# Patient Record
Sex: Male | Born: 1970
Health system: Southern US, Community
[De-identification: ages and names within clinical notes are randomized; demographics above are authoritative.]

## PROBLEM LIST (undated history)

## (undated) DIAGNOSIS — M549 Dorsalgia, unspecified: Secondary | ICD-10-CM

## (undated) DIAGNOSIS — G4730 Sleep apnea, unspecified: Secondary | ICD-10-CM

## (undated) DIAGNOSIS — F32A Depression, unspecified: Secondary | ICD-10-CM

## (undated) DIAGNOSIS — E669 Obesity, unspecified: Secondary | ICD-10-CM

## (undated) DIAGNOSIS — K219 Gastro-esophageal reflux disease without esophagitis: Secondary | ICD-10-CM

## (undated) DIAGNOSIS — G8929 Other chronic pain: Secondary | ICD-10-CM

## (undated) DIAGNOSIS — D649 Anemia, unspecified: Secondary | ICD-10-CM

## (undated) DIAGNOSIS — Z22322 Carrier or suspected carrier of Methicillin resistant Staphylococcus aureus: Secondary | ICD-10-CM

## (undated) DIAGNOSIS — I509 Heart failure, unspecified: Secondary | ICD-10-CM

## (undated) DIAGNOSIS — I10 Essential (primary) hypertension: Secondary | ICD-10-CM

## (undated) DIAGNOSIS — J449 Chronic obstructive pulmonary disease, unspecified: Secondary | ICD-10-CM

## (undated) DIAGNOSIS — I2699 Other pulmonary embolism without acute cor pulmonale: Secondary | ICD-10-CM

## (undated) DIAGNOSIS — I872 Venous insufficiency (chronic) (peripheral): Secondary | ICD-10-CM

## (undated) DIAGNOSIS — M542 Cervicalgia: Secondary | ICD-10-CM

## (undated) DIAGNOSIS — F329 Major depressive disorder, single episode, unspecified: Secondary | ICD-10-CM

## (undated) DIAGNOSIS — I2510 Atherosclerotic heart disease of native coronary artery without angina pectoris: Secondary | ICD-10-CM

## (undated) DIAGNOSIS — G959 Disease of spinal cord, unspecified: Secondary | ICD-10-CM

## (undated) HISTORY — PX: SKIN GRAFT: SHX250

## (undated) HISTORY — PX: ROTATOR CUFF REPAIR: SHX139

## (undated) HISTORY — PX: FEMUR SURGERY: SHX943

## (undated) HISTORY — PX: LIVER SURGERY: SHX698

---

## 2006-03-09 ENCOUNTER — Ambulatory Visit: Payer: Self-pay | Admitting: Cardiology

## 2007-01-29 ENCOUNTER — Ambulatory Visit: Payer: Self-pay | Admitting: Cardiology

## 2007-01-29 ENCOUNTER — Inpatient Hospital Stay (HOSPITAL_COMMUNITY): Admission: EM | Admit: 2007-01-29 | Discharge: 2007-01-31 | Payer: Self-pay | Admitting: Cardiology

## 2007-05-19 ENCOUNTER — Encounter: Admission: RE | Admit: 2007-05-19 | Discharge: 2007-05-19 | Payer: Self-pay | Admitting: Sports Medicine

## 2007-06-15 ENCOUNTER — Encounter: Admission: RE | Admit: 2007-06-15 | Discharge: 2007-06-15 | Payer: Self-pay | Admitting: Sports Medicine

## 2007-06-29 ENCOUNTER — Encounter: Admission: RE | Admit: 2007-06-29 | Discharge: 2007-06-29 | Payer: Self-pay | Admitting: Sports Medicine

## 2008-04-17 ENCOUNTER — Encounter: Admission: RE | Admit: 2008-04-17 | Discharge: 2008-04-17 | Payer: Self-pay | Admitting: Orthopaedic Surgery

## 2008-05-12 ENCOUNTER — Emergency Department (HOSPITAL_COMMUNITY): Admission: EM | Admit: 2008-05-12 | Discharge: 2008-05-12 | Payer: Self-pay | Admitting: Emergency Medicine

## 2008-05-20 ENCOUNTER — Emergency Department (HOSPITAL_COMMUNITY): Admission: EM | Admit: 2008-05-20 | Discharge: 2008-05-20 | Payer: Self-pay | Admitting: Emergency Medicine

## 2008-05-23 ENCOUNTER — Encounter: Admission: RE | Admit: 2008-05-23 | Discharge: 2008-05-27 | Payer: Self-pay | Admitting: Anesthesiology

## 2008-05-27 ENCOUNTER — Ambulatory Visit: Payer: Self-pay | Admitting: Anesthesiology

## 2008-05-27 ENCOUNTER — Emergency Department (HOSPITAL_COMMUNITY): Admission: EM | Admit: 2008-05-27 | Discharge: 2008-05-27 | Payer: Self-pay | Admitting: Emergency Medicine

## 2008-06-28 ENCOUNTER — Encounter: Admission: RE | Admit: 2008-06-28 | Discharge: 2008-06-28 | Payer: Self-pay | Admitting: Orthopaedic Surgery

## 2008-11-17 ENCOUNTER — Ambulatory Visit: Payer: Self-pay | Admitting: Cardiology

## 2009-08-04 ENCOUNTER — Emergency Department (HOSPITAL_COMMUNITY): Admission: EM | Admit: 2009-08-04 | Discharge: 2009-08-04 | Payer: Self-pay | Admitting: Emergency Medicine

## 2010-02-01 ENCOUNTER — Encounter: Payer: Self-pay | Admitting: Sports Medicine

## 2010-03-27 LAB — POCT CARDIAC MARKERS
CKMB, poc: <1 ng/mL — ABNORMAL LOW (ref 1.0–8.0)
Myoglobin, poc: 63.9 ng/mL (ref 12–200)
Troponin i, poc: <0.05 ng/mL (ref 0.00–0.09)

## 2010-03-27 LAB — POCT I-STAT, CHEM 8
Chloride: 100 mEq/L (ref 96–112)
Creatinine, Ser: 1 mg/dL (ref 0.4–1.5)
Glucose, Bld: 104 mg/dL — ABNORMAL HIGH (ref 70–99)
Hemoglobin: 13.6 g/dL (ref 13.0–17.0)
Potassium: 3.6 mEq/L (ref 3.5–5.1)
Sodium: 141 mEq/L (ref 135–145)

## 2010-04-20 LAB — CBC
MCHC: 33.3 g/dL (ref 30.0–36.0)
MCV: 84.4 fL (ref 78.0–100.0)
RDW: 17.5 % — ABNORMAL HIGH (ref 11.5–15.5)
WBC: 7.7 10*3/uL (ref 4.0–10.5)

## 2010-04-20 LAB — DIFFERENTIAL
Basophils Absolute: 0 K/uL (ref 0.0–0.1)
Basophils Relative: 0 % (ref 0–1)
Eosinophils Absolute: 0.3 K/uL (ref 0.0–0.7)
Eosinophils Relative: 4 % (ref 0–5)
Lymphocytes Relative: 13 % (ref 12–46)
Lymphs Abs: 1 K/uL (ref 0.7–4.0)
Monocytes Absolute: 0.5 K/uL (ref 0.1–1.0)
Monocytes Relative: 6 % (ref 3–12)
Neutro Abs: 6 K/uL (ref 1.7–7.7)
Neutrophils Relative %: 77 % (ref 43–77)

## 2010-04-20 LAB — BASIC METABOLIC PANEL
BUN: 16 mg/dL (ref 6–23)
Glucose, Bld: 154 mg/dL — ABNORMAL HIGH (ref 70–99)
Potassium: 4.2 mEq/L (ref 3.5–5.1)
Sodium: 141 mEq/L (ref 135–145)

## 2010-04-20 LAB — ETHANOL

## 2010-04-20 LAB — ACETAMINOPHEN LEVEL: Acetaminophen (Tylenol), Serum: <10 ug/mL — ABNORMAL LOW (ref 10–30)
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
## 2010-05-25 NOTE — Cardiovascular Report (Signed)
NAME:  [name], [name]                ACCOUNT NO.:  [id number]   MEDICAL RECORD NO.:  [id number]          PATIENT TYPE:  INP   LOCATION:  3314                         FACILITY:  [facility]   PHYSICIAN:  Veverly Fells. Excell Seltzer, MD  DATE OF BIRTH:  Feb 05, 1970   DATE OF PROCEDURE:  01/30/2007  DATE OF DISCHARGE:                            CARDIAC CATHETERIZATION   PROCEDURE:  Left heart catheterization, selective coronary angiography,  left ventricular angiography, Star close the right femoral artery.   INDICATIONS:  Cody Cordova is a 40 year old gentleman with diabetes and  morbid obesity.  He presented with a chest pain syndrome.  In the  setting of multiple risk factors.  He was referred for cardiac  catheterization.   Risks and indications of procedure were reviewed with the patient.  Informed consent was obtained.  The right groin was prepped, draped,  anesthetized with 1% lidocaine. Using modified Seldinger technique 6-  French sheath was placed in the right femoral artery.  Standard 6-French  Judkins catheters were used for coronary angiography an angled pigtail  catheter was used for left ventriculography.  All catheter exchanges  were performed over a guidewire.  At completion of procedure, a Star  close device was used to seal the femoral arteriotomy.  There were no  immediate complications.   FINDINGS:  Aortic pressure 139/94 with a mean of 116, left ventricular  pressure 138/25.   Left mainstem:  The left mainstem has no significant angiographic  stenosis.  There is minimal plaque present.  The left main bifurcates  into the LAD and left circumflex.   The LAD is a large-caliber vessel that courses down and reaches the left  ventricular apex.  There is a small first diagonal and a large second  diagonal branch present. The second diagonal bifurcates into twin  vessels and has no significant angiographic stenosis.  There is minimal  plaque in the proximal LAD but no significant stenosis  throughout.   Left circumflex:  Left circumflex is large-caliber.  There is mild  ectasia in the proximal to mid vessel with no significant stenosis  associated.  There is a small first OM, medium second OM, and a larger  left posterolateral branch.  There is also an atrial branch that arises  from the mid circumflex.  There is no significant angiographic stenosis  throughout the left circumflex.   The right coronary artery is codominant with the circumflex.  There is  diffuse minor plaque throughout the proximal and mid vessel. There is a  large acute marginal branch that arises from the mid vessel.  The PDA  appears to supply only the base of the inferior wall.  There is no  significant stenosis in the PDA.   Left ventricular function:  The LVEF is normal, 65-70%.  There is no  mitral regurgitation.   ASSESSMENT:  1. Minimal diffuse nonobstructive CAD.  2. Normal left ventricular function.   [name] does not have any significant stenoses.  He should continue  therapy for his diabetes and hypertension.  He can follow-up with his  primary care physician.  Veverly Fells. Excell Seltzer, MD  Electronically Signed     MDC/MEDQ  D:  01/30/2007  T:  01/31/2007  Job:  [id number]

## 2010-09-15 ENCOUNTER — Other Ambulatory Visit: Payer: Self-pay | Admitting: Internal Medicine

## 2010-09-15 DIAGNOSIS — M545 Low back pain: Secondary | ICD-10-CM

## 2010-09-19 ENCOUNTER — Inpatient Hospital Stay
Admission: RE | Admit: 2010-09-19 | Discharge: 2010-09-19 | Payer: Self-pay | Source: Ambulatory Visit | Attending: Internal Medicine | Admitting: Internal Medicine

## 2010-10-01 LAB — COMPREHENSIVE METABOLIC PANEL
AST: 24
Calcium: 8.9
Creatinine, Ser: 0.84
GFR calc non Af Amer: >60
Total Bilirubin: 0.6

## 2010-10-01 LAB — LIPID PANEL
LDL Cholesterol: 115 — ABNORMAL HIGH
Total CHOL/HDL Ratio: 4.3
Triglycerides: 46
VLDL: 9

## 2010-10-01 LAB — CARDIAC PANEL(CRET KIN+CKTOT+MB+TROPI)
CK, MB: 0.8
CK, MB: 0.9
Relative Index: INVALID
Relative Index: INVALID
Total CK: 96
Total CK: 99
Troponin I: 0.01
Troponin I: 0.01
Troponin I: 0.01

## 2010-10-01 LAB — HEMOGLOBIN A1C: Mean Plasma Glucose: 186

## 2010-10-01 LAB — HEPARIN LEVEL (UNFRACTIONATED): Heparin Unfractionated: 0.5

## 2010-10-11 DIAGNOSIS — Z22322 Carrier or suspected carrier of Methicillin resistant Staphylococcus aureus: Secondary | ICD-10-CM

## 2010-10-11 HISTORY — DX: Carrier or suspected carrier of methicillin resistant Staphylococcus aureus: Z22.322

## 2010-10-13 ENCOUNTER — Inpatient Hospital Stay (HOSPITAL_COMMUNITY): Payer: PRIVATE HEALTH INSURANCE

## 2010-10-13 ENCOUNTER — Inpatient Hospital Stay (HOSPITAL_COMMUNITY)
Admission: AD | Admit: 2010-10-13 | Discharge: 2010-11-01 | DRG: 471 | Disposition: A | Discharge status: Rehabilitation Facility | Payer: PRIVATE HEALTH INSURANCE | Source: Ambulatory Visit | Attending: Orthopedic Surgery | Admitting: Orthopedic Surgery

## 2010-10-13 DIAGNOSIS — L0100 Impetigo, unspecified: Secondary | ICD-10-CM | POA: Diagnosis not present

## 2010-10-13 DIAGNOSIS — G4733 Obstructive sleep apnea (adult) (pediatric): Secondary | ICD-10-CM | POA: Diagnosis present

## 2010-10-13 DIAGNOSIS — F3289 Other specified depressive episodes: Secondary | ICD-10-CM | POA: Diagnosis present

## 2010-10-13 DIAGNOSIS — Z981 Arthrodesis status: Secondary | ICD-10-CM

## 2010-10-13 DIAGNOSIS — Z79899 Other long term (current) drug therapy: Secondary | ICD-10-CM

## 2010-10-13 DIAGNOSIS — M5000 Cervical disc disorder with myelopathy, unspecified cervical region: Principal | ICD-10-CM | POA: Diagnosis present

## 2010-10-13 DIAGNOSIS — I2510 Atherosclerotic heart disease of native coronary artery without angina pectoris: Secondary | ICD-10-CM | POA: Diagnosis present

## 2010-10-13 DIAGNOSIS — I872 Venous insufficiency (chronic) (peripheral): Secondary | ICD-10-CM | POA: Diagnosis present

## 2010-10-13 DIAGNOSIS — M109 Gout, unspecified: Secondary | ICD-10-CM | POA: Diagnosis present

## 2010-10-13 DIAGNOSIS — A4902 Methicillin resistant Staphylococcus aureus infection, unspecified site: Secondary | ICD-10-CM | POA: Diagnosis not present

## 2010-10-13 DIAGNOSIS — J449 Chronic obstructive pulmonary disease, unspecified: Secondary | ICD-10-CM | POA: Diagnosis present

## 2010-10-13 DIAGNOSIS — Z5309 Procedure and treatment not carried out because of other contraindication: Secondary | ICD-10-CM

## 2010-10-13 DIAGNOSIS — J4489 Other specified chronic obstructive pulmonary disease: Secondary | ICD-10-CM | POA: Diagnosis present

## 2010-10-13 DIAGNOSIS — G8250 Quadriplegia, unspecified: Secondary | ICD-10-CM | POA: Diagnosis present

## 2010-10-13 DIAGNOSIS — F329 Major depressive disorder, single episode, unspecified: Secondary | ICD-10-CM | POA: Diagnosis present

## 2010-10-13 DIAGNOSIS — Z794 Long term (current) use of insulin: Secondary | ICD-10-CM

## 2010-10-13 DIAGNOSIS — D649 Anemia, unspecified: Secondary | ICD-10-CM | POA: Diagnosis present

## 2010-10-13 DIAGNOSIS — E876 Hypokalemia: Secondary | ICD-10-CM | POA: Diagnosis present

## 2010-10-13 DIAGNOSIS — K5900 Constipation, unspecified: Secondary | ICD-10-CM | POA: Diagnosis not present

## 2010-10-13 DIAGNOSIS — J9600 Acute respiratory failure, unspecified whether with hypoxia or hypercapnia: Secondary | ICD-10-CM | POA: Diagnosis not present

## 2010-10-13 DIAGNOSIS — K219 Gastro-esophageal reflux disease without esophagitis: Secondary | ICD-10-CM | POA: Diagnosis present

## 2010-10-13 DIAGNOSIS — T502X5A Adverse effect of carbonic-anhydrase inhibitors, benzothiadiazides and other diuretics, initial encounter: Secondary | ICD-10-CM | POA: Diagnosis present

## 2010-10-13 DIAGNOSIS — Z418 Encounter for other procedures for purposes other than remedying health state: Secondary | ICD-10-CM

## 2010-10-13 DIAGNOSIS — Z2989 Encounter for other specified prophylactic measures: Secondary | ICD-10-CM

## 2010-10-13 DIAGNOSIS — E119 Type 2 diabetes mellitus without complications: Secondary | ICD-10-CM | POA: Diagnosis present

## 2010-10-13 DIAGNOSIS — I10 Essential (primary) hypertension: Secondary | ICD-10-CM | POA: Diagnosis present

## 2010-10-13 DIAGNOSIS — Z6841 Body Mass Index (BMI) 40.0 and over, adult: Secondary | ICD-10-CM

## 2010-10-13 DIAGNOSIS — E785 Hyperlipidemia, unspecified: Secondary | ICD-10-CM | POA: Diagnosis present

## 2010-10-13 HISTORY — DX: Atherosclerotic heart disease of native coronary artery without angina pectoris: I25.10

## 2010-10-13 HISTORY — DX: Chronic obstructive pulmonary disease, unspecified: J44.9

## 2010-10-13 HISTORY — DX: Essential (primary) hypertension: I10

## 2010-10-13 HISTORY — DX: Venous insufficiency (chronic) (peripheral): I87.2

## 2010-10-13 HISTORY — DX: Obesity, unspecified: E66.9

## 2010-10-13 LAB — CBC
HCT: 34.3 % — ABNORMAL LOW (ref 39.0–52.0)
Hemoglobin: 10.9 g/dL — ABNORMAL LOW (ref 13.0–17.0)
MCH: 24.8 pg — ABNORMAL LOW (ref 26.0–34.0)
RBC: 4.39 MIL/uL (ref 4.22–5.81)

## 2010-10-13 LAB — COMPREHENSIVE METABOLIC PANEL
ALT: 20 U/L (ref 0–53)
Albumin: 3.5 g/dL (ref 3.5–5.2)
Alkaline Phosphatase: 99 U/L (ref 39–117)
Chloride: 95 mEq/L — ABNORMAL LOW (ref 96–112)
Potassium: 2.9 mEq/L — ABNORMAL LOW (ref 3.5–5.1)
Sodium: 138 mEq/L (ref 135–145)
Total Bilirubin: 0.3 mg/dL (ref 0.3–1.2)
Total Protein: 7.6 g/dL (ref 6.0–8.3)

## 2010-10-13 LAB — DIFFERENTIAL
Basophils Absolute: 0 10*3/uL (ref 0.0–0.1)
Basophils Relative: 0 % (ref 0–1)
Lymphocytes Relative: 16 % (ref 12–46)
Monocytes Absolute: 0.5 10*3/uL (ref 0.1–1.0)
Monocytes Relative: 8 % (ref 3–12)
Neutro Abs: 5 10*3/uL (ref 1.7–7.7)
Neutrophils Relative %: 73 % (ref 43–77)

## 2010-10-13 LAB — PROTIME-INR
INR: 1 (ref 0.00–1.49)
Prothrombin Time: 13.4 seconds (ref 11.6–15.2)

## 2010-10-14 ENCOUNTER — Inpatient Hospital Stay (HOSPITAL_COMMUNITY): Payer: PRIVATE HEALTH INSURANCE

## 2010-10-14 LAB — URINALYSIS, MICROSCOPIC ONLY
Leukocytes, UA: NEGATIVE
Nitrite: NEGATIVE
Specific Gravity, Urine: 1.014 (ref 1.005–1.030)
Urobilinogen, UA: 1 mg/dL (ref 0.0–1.0)

## 2010-10-14 LAB — GLUCOSE, CAPILLARY
Glucose-Capillary: 143 mg/dL — ABNORMAL HIGH (ref 70–99)
Glucose-Capillary: 142 mg/dL — ABNORMAL HIGH (ref 70–99)

## 2010-10-15 ENCOUNTER — Inpatient Hospital Stay (HOSPITAL_COMMUNITY): Payer: PRIVATE HEALTH INSURANCE

## 2010-10-15 LAB — HEMOGLOBIN A1C
Hgb A1c MFr Bld: 7.6 % — ABNORMAL HIGH (ref <5.7)
Mean Plasma Glucose: 171 mg/dL — ABNORMAL HIGH (ref <117)

## 2010-10-15 LAB — FOLATE: Folate: 8.9 ng/mL

## 2010-10-15 LAB — CBC
HCT: 33.6 % — ABNORMAL LOW (ref 39.0–52.0)
MCH: 24.4 pg — ABNORMAL LOW (ref 26.0–34.0)
MCHC: 30.4 g/dL (ref 30.0–36.0)
MCV: 80.4 fL (ref 78.0–100.0)
RDW: 16.9 % — ABNORMAL HIGH (ref 11.5–15.5)
WBC: 7.8 10*3/uL (ref 4.0–10.5)

## 2010-10-15 LAB — BASIC METABOLIC PANEL
BUN: 12 mg/dL (ref 6–23)
Chloride: 102 mEq/L (ref 96–112)
Creatinine, Ser: 0.81 mg/dL (ref 0.50–1.35)
GFR calc Af Amer: >90 mL/min (ref >90)

## 2010-10-15 LAB — VITAMIN B12: Vitamin B-12: 279 pg/mL (ref 211–911)

## 2010-10-15 LAB — LIPID PANEL
Total CHOL/HDL Ratio: 4.9 RATIO
VLDL: 17 mg/dL (ref 0–40)

## 2010-10-15 LAB — GLUCOSE, CAPILLARY: Glucose-Capillary: 105 mg/dL — ABNORMAL HIGH (ref 70–99)

## 2010-10-15 LAB — PHOSPHORUS: Phosphorus: 3.3 mg/dL (ref 2.3–4.6)

## 2010-10-15 LAB — IRON AND TIBC: TIBC: 337 ug/dL (ref 215–435)

## 2010-10-15 LAB — MAGNESIUM: Magnesium: 1.9 mg/dL (ref 1.5–2.5)

## 2010-10-16 LAB — CBC
HCT: 33.1 % — ABNORMAL LOW (ref 39.0–52.0)
MCHC: 29.9 g/dL — ABNORMAL LOW (ref 30.0–36.0)
MCV: 80.9 fL (ref 78.0–100.0)
Platelets: 218 10*3/uL (ref 150–400)
RDW: 17.2 % — ABNORMAL HIGH (ref 11.5–15.5)
WBC: 5.5 10*3/uL (ref 4.0–10.5)

## 2010-10-16 LAB — BASIC METABOLIC PANEL
BUN: 12 mg/dL (ref 6–23)
Calcium: 9.3 mg/dL (ref 8.4–10.5)
Chloride: 104 mEq/L (ref 96–112)
Creatinine, Ser: 0.81 mg/dL (ref 0.50–1.35)
GFR calc Af Amer: >90 mL/min (ref >90)
GFR calc non Af Amer: >90 mL/min (ref >90)

## 2010-10-16 LAB — GLUCOSE, CAPILLARY: Glucose-Capillary: 55 mg/dL — ABNORMAL LOW (ref 70–99)

## 2010-10-17 LAB — GLUCOSE, CAPILLARY
Glucose-Capillary: 76 mg/dL (ref 70–99)
Glucose-Capillary: 118 mg/dL — ABNORMAL HIGH (ref 70–99)
Glucose-Capillary: 60 mg/dL — ABNORMAL LOW (ref 70–99)

## 2010-10-17 LAB — BASIC METABOLIC PANEL
CO2: 31 mEq/L (ref 19–32)
Chloride: 103 mEq/L (ref 96–112)
Creatinine, Ser: 0.88 mg/dL (ref 0.50–1.35)
GFR calc Af Amer: >90 mL/min (ref >90)
Potassium: 3.6 mEq/L (ref 3.5–5.1)
Sodium: 141 mEq/L (ref 135–145)

## 2010-10-18 ENCOUNTER — Inpatient Hospital Stay (HOSPITAL_COMMUNITY): Payer: PRIVATE HEALTH INSURANCE

## 2010-10-18 ENCOUNTER — Encounter (HOSPITAL_COMMUNITY): Payer: Self-pay

## 2010-10-18 DIAGNOSIS — L02219 Cutaneous abscess of trunk, unspecified: Secondary | ICD-10-CM

## 2010-10-18 DIAGNOSIS — L0100 Impetigo, unspecified: Secondary | ICD-10-CM

## 2010-10-18 DIAGNOSIS — L03319 Cellulitis of trunk, unspecified: Secondary | ICD-10-CM

## 2010-10-18 LAB — GLUCOSE, CAPILLARY
Glucose-Capillary: 65 mg/dL — ABNORMAL LOW (ref 70–99)
Glucose-Capillary: 49 mg/dL — ABNORMAL LOW (ref 70–99)
Glucose-Capillary: 95 mg/dL (ref 70–99)

## 2010-10-18 LAB — GRAM STAIN

## 2010-10-19 LAB — GLUCOSE, CAPILLARY
Glucose-Capillary: 138 mg/dL — ABNORMAL HIGH (ref 70–99)
Glucose-Capillary: 77 mg/dL (ref 70–99)
Glucose-Capillary: 91 mg/dL (ref 70–99)

## 2010-10-20 LAB — CBC
Hemoglobin: 9.3 g/dL — ABNORMAL LOW (ref 13.0–17.0)
MCHC: 29.9 g/dL — ABNORMAL LOW (ref 30.0–36.0)
Platelets: 248 10*3/uL (ref 150–400)

## 2010-10-20 LAB — BASIC METABOLIC PANEL
BUN: 10 mg/dL (ref 6–23)
Calcium: 9.2 mg/dL (ref 8.4–10.5)
Chloride: 103 mEq/L (ref 96–112)
GFR calc Af Amer: >90 mL/min (ref >90)
GFR calc Af Amer: >90 mL/min (ref >90)
GFR calc non Af Amer: >90 mL/min (ref >90)
GFR calc non Af Amer: >90 mL/min (ref >90)
Glucose, Bld: 102 mg/dL — ABNORMAL HIGH (ref 70–99)
Potassium: 3.7 mEq/L (ref 3.5–5.1)
Potassium: 4 mEq/L (ref 3.5–5.1)
Sodium: 142 mEq/L (ref 135–145)
Sodium: 141 mEq/L (ref 135–145)

## 2010-10-20 LAB — VANCOMYCIN, TROUGH: Vancomycin Tr: 14.2 ug/mL (ref 10.0–20.0)

## 2010-10-20 LAB — GLUCOSE, CAPILLARY
Glucose-Capillary: 140 mg/dL — ABNORMAL HIGH (ref 70–99)
Glucose-Capillary: 103 mg/dL — ABNORMAL HIGH (ref 70–99)
Glucose-Capillary: 67 mg/dL — ABNORMAL LOW (ref 70–99)

## 2010-10-21 DIAGNOSIS — L0100 Impetigo, unspecified: Secondary | ICD-10-CM

## 2010-10-21 LAB — GLUCOSE, CAPILLARY
Glucose-Capillary: 51 mg/dL — ABNORMAL LOW (ref 70–99)
Glucose-Capillary: 59 mg/dL — ABNORMAL LOW (ref 70–99)
Glucose-Capillary: 111 mg/dL — ABNORMAL HIGH (ref 70–99)
Glucose-Capillary: 60 mg/dL — ABNORMAL LOW (ref 70–99)
Glucose-Capillary: 106 mg/dL — ABNORMAL HIGH (ref 70–99)
Glucose-Capillary: 165 mg/dL — ABNORMAL HIGH (ref 70–99)

## 2010-10-21 LAB — CROSSMATCH
ABO/RH(D): A POS
Antibody Screen: NEGATIVE
Unit division: 0

## 2010-10-21 LAB — WOUND CULTURE

## 2010-10-21 NOTE — Consult Note (Signed)
NAME:  Cody Cordova, Cody Cordova NO.:  [id number]  MEDICAL RECORD NO.:  [id number]  LOCATION:  5509                         FACILITY:  [facility]  PHYSICIAN:  Rosanna Randy, MDDATE OF BIRTH:  23-Jun-1970  DATE OF CONSULTATION:  10/14/2010 DATE OF DISCHARGE:                                CONSULTATION   REASON FOR CONSULTATION:  Help managing medical problems (the patient with diabetes, hypertension, hyperlipidemia, asthma/COPD, and hypokalemia).  PRIMARY CARE PHYSICIAN:  [name]  HISTORY OF PRESENT ILLNESS:  Cody Cordova is a 40 year old male with a past medical history of diabetes, hypertension, hyperlipidemia, COPD, and obesity admitted secondary to cervical myelopathy/weakness in the acute setting after history of fall.  The patient is now complaining of weakness, difficulty walking, and pain on his upper back and cervical area.  The patient was admitted by Neurosurgery team in order to rule out cord compression and provide appropriate treatment if present. Internal Medicine has been consulted in order to help managing the patient's medical problems.  ALLERGIES:  IODINE and SULFA.  PAST MEDICAL HISTORY:  Hypertension, diabetes, hyperlipidemia, asthma/COPD, chronic rhinitis, GERD, depression, nonobstructive coronary artery disease with a normal ejection fraction, obesity, and venous insufficiency.  MEDICATIONS:  The patient prior to admission was using, 1. Hydrocodone 10/325 mg by mouth 1-2 tablets every 6 hours as needed     for pain. 2. Lasix 40 mg 1-2 tablets daily for excess of fluid. 3. Potassium 20 mEq daily. 4. Singulair 10 mg 1 tablet daily. 5. Advair 250/50 one puff twice a day. 6. Seroquel 300 mg 1 tablet daily. 7. Lantus 80 units twice a day subcutaneously. 8. Spironolactone 25 mg 1 tablet daily. 9. Protopic topical 1% one application twice a day. 10.Clarinex 5 mg 1 tablet daily. 11.Coreg 12.5 mg one tablet twice daily. 12.Spiriva 18  mcg 1 tablet daily. 13.Ketoconazole shampoo topical one application every 3 days. 14.Lexapro 20 mg 1 tablet by mouth daily. 15.Nexium 40 mg 1 tablet by mouth daily. 16.Fluticasone topical 0.5% one application twice a day. 17.Robaxin 500 mg 2 tablets twice a day. 18.Lipitor 20 mg 1 tablet daily. 19.Opana 30 mg 1 tablet every 6 hours. 20.Taclonex topical one application in to the affected area. 21.Sliding scale insulin using NovoLog three times a day with meals. 22.Amlodipine 5 mg 1 tablet by mouth daily. 23.Remeron 15 mg at bedtime.  SOCIAL HISTORY:  The patient is single.  There is no history of tobacco, alcohol, or illicit drugs.  FAMILY HISTORY:  Significant for father having MI in his 69s.  REVIEW OF SYSTEMS:  Negative except as mentioned on HPI, which is pertinent for back and neck pain with upper extremities weakness and some associated difficulty walking.  PHYSICAL EXAMINATION:  VITAL SIGNS:  The patient have a temperature of 97.7 with a heart rate of 63, respiratory rate 18, blood pressure 112/56, oxygen saturation 93% on room air. GENERAL:  The patient was in no acute distress. RESPIRATORY:  There was no wheezing with good air movement. HEART:  Regular rate and rhythm.  No murmurs, gallops, or rubs. ABDOMEN:  Obese, nontender, nondistended.  Positive bowel sounds.  No guarding. EXTREMITIES:  1-2+ plus edema bilaterally.  NEUROLOGIC:  Upper extremity weakness 3/5 bilaterally.  The patient was alert, awake, and oriented x3.  No other focal deficit was appreciated.  LABORATORY DATA:  The patient have a positive MRSA, normal urinalysis. He had a BMET with a sodium of 138, potassium 2.9, chloride 95, bicarb 37, BUN 11, creatinine 0.71, blood sugar 316.  RADIOLOGY/IMAGING STUDIES:  Chest x-ray two views October 14, 2010 demonstrated cardiac enlargement with mild pulmonary congestion, but there was no edema, effusion, or consolidations, unchanged from previous x-ray.  MRI of  the cervical spine without contrast still pending at the moment of this dictation.  ASSESSMENT AND PLAN: 1. Cervical myelopathy with acute weakness in upper extremities and     difficulty walking.  This is something that is going to be     addressed by primary team specifically ruling out acute cord     injury.  We will also add a TSH and a B12 level. 2. Hypertension.  Continue home medications.  Blood pressure is stable     and this mild acute elevation was most likely driven by pain.  We     will continue pain treatment. 3. Diabetes.  We are going to check a hemoglobin A1c in order to     assess the status of glycemic control.  We are going to continue     sliding scale insulin moderate, but we will go ahead and add meal     coverage and also increase his Lantus by 4 units, 2 in the morning     and two at night for better control. 4. Hyperlipidemia.  We are going to check a fasting lipid profile.  We     are going to continue statins.  We will adjust medications as     needed. 5. Hypokalemia secondary to the use of diuretics.  We are going to     replete potassium.  We are going to check a magnesium and also a     phosphorus level.  Agree with daily maintenance of 40 mEq of     potassium. 6. Anemia, might be anemia of chronic disease, most likely, but the     patient's MCV borderline low, so will go ahead and check an anemia     panel to make sure that nothing else needs to be addressed for this     problem. 7. Venous insufficiency.  The patient received information to keep his     legs elevated as much as possible.  He is sitting up.  We are going     to continue diuretics.  He needs to have TED hose fitted and to     start using them. 8. Gastroesophageal reflux disease.  We are going to continue PPI. 9. Asthma/chronic obstructive pulmonary disease, at this point stable     with a good air movement and no wheezing; will continue the     patient's inhalers and current home  medications. 10.Depression.  We are going to continue home meds.  There is no     suicidal ideation and the patient's mood is stable. 11.Nonobstructive coronary artery disease by cath, performed by Dr.     Excell Seltzer in January 2009 and currently not complaining of any chest     pain; the patient is going to require aspirin on a daily basis once     the need of surgery or the surgery itself has been done. 12.Allergic rhinitis, currently not congested.  We will continue using  Flonase and Clarinex. 13.Positive methicillin-resistant Staphylococcus aureus screening by     PCR.  The patient has been placed on contact isolation and he is     going to start receiving treatment per MRSA protocol for     decolonization. 14.Deep venous thrombosis.  At this point, he will continue using SCDs     since he is most likely in the need of surgery.  We will go ahead and follow this patient along with you.  Thanks for consultation.  Any questions, feel free to contact Internal Medicine Department.     Rosanna Randy, MD     CEM/MEDQ  D:  10/14/2010  T:  10/14/2010  Job:  [id number]  cc:   [name]  Electronically Signed by Vassie Loll MD on 10/21/2010 08:20:08 AM

## 2010-10-22 LAB — GLUCOSE, CAPILLARY: Glucose-Capillary: 110 mg/dL — ABNORMAL HIGH (ref 70–99)

## 2010-10-22 NOTE — Op Note (Signed)
  NAME:  Cody Cordova, Cody Cordova NO.:  [id number]  MEDICAL RECORD NO.:  [id number]  LOCATION:  5004                         FACILITY:  [facility]  PHYSICIAN:  Nelda Severe, MD      DATE OF BIRTH:  Aug 06, 1970  DATE OF PROCEDURE: DATE OF DISCHARGE:                              OPERATIVE REPORT   PREOPERATIVE DIAGNOSIS:  C6-7 disk herniation with cervical myelopathy/quadriparesis.  POSTPROCEDURE DIAGNOSIS:  C6-7 disk herniation with cervical myelopathy/quadriparesis plus probable impetigo (Staph skin infection).  OPERATIVE NOTE:  The patient was placed on the [place] table and anesthetized.  When his gown was removed, it was apparent that Tegaderm had been used to dress an excoriated area associated with previous midline abdominal incision.  Frank pus had collected under the Tegaderm. On further inspection, he appeared to have an excoriated area over a total hip wound that have a number of cutaneous lesions consistent with impetigo.  Not withstanding the relatively urgent nature of the proposed surgery, decompression of the spinal cord and fusion at C6-7, I felt that we could not go ahead with the surgery at this time.  He has been quadriparetic for about 2 months.  I think that delaying this surgery long enough to further characterize the skin infection untreated, is the prudent decision at this time, especially in view of the fact that an infected anterior cervical fusion would be a disastrous situation, making his overall condition quite possibly worse.  Accordingly, we decided not to go ahead with a cervical surgery.  The Tegaderm was removed.  The anterior abdominal wall was scrubbed with Betadine.  A new absorbent dressing was applied.  Cultures were taken for aerobic and anaerobic study as well as Gram stain.     Nelda Severe, MD     MT/MEDQ  D:  10/18/2010  T:  10/19/2010  Job:  [id number]  Electronically Signed by Nelda Severe MD on 10/22/2010 02:15:42  PM

## 2010-10-23 ENCOUNTER — Inpatient Hospital Stay (HOSPITAL_COMMUNITY): Payer: PRIVATE HEALTH INSURANCE

## 2010-10-23 ENCOUNTER — Other Ambulatory Visit: Payer: Self-pay | Admitting: Orthopedic Surgery

## 2010-10-23 ENCOUNTER — Other Ambulatory Visit (HOSPITAL_COMMUNITY): Payer: PRIVATE HEALTH INSURANCE

## 2010-10-23 HISTORY — PX: CERVICAL FUSION: SHX112

## 2010-10-23 LAB — GLUCOSE, CAPILLARY
Glucose-Capillary: 114 mg/dL — ABNORMAL HIGH (ref 70–99)
Glucose-Capillary: 134 mg/dL — ABNORMAL HIGH (ref 70–99)

## 2010-10-23 LAB — BASIC METABOLIC PANEL
BUN: 14 mg/dL (ref 6–23)
Chloride: 99 mEq/L (ref 96–112)
GFR calc Af Amer: >90 mL/min (ref >90)
Glucose, Bld: 120 mg/dL — ABNORMAL HIGH (ref 70–99)
Potassium: 3.6 mEq/L (ref 3.5–5.1)
Sodium: 141 mEq/L (ref 135–145)

## 2010-10-23 LAB — DIFFERENTIAL
Basophils Absolute: 0 10*3/uL (ref 0.0–0.1)
Basophils Relative: 0 % (ref 0–1)
Eosinophils Absolute: 0.3 10*3/uL (ref 0.0–0.7)
Eosinophils Relative: 5 % (ref 0–5)
Lymphocytes Relative: 18 % (ref 12–46)
Monocytes Absolute: 0.5 10*3/uL (ref 0.1–1.0)
Monocytes Relative: 8 % (ref 3–12)
Neutro Abs: 4.4 10*3/uL (ref 1.7–7.7)

## 2010-10-23 LAB — CBC
HCT: 33.3 % — ABNORMAL LOW (ref 39.0–52.0)
Hemoglobin: 10.1 g/dL — ABNORMAL LOW (ref 13.0–17.0)
RDW: 16.5 % — ABNORMAL HIGH (ref 11.5–15.5)
WBC: 6.3 10*3/uL (ref 4.0–10.5)

## 2010-10-23 LAB — ANAEROBIC CULTURE

## 2010-10-24 ENCOUNTER — Inpatient Hospital Stay (HOSPITAL_COMMUNITY): Payer: PRIVATE HEALTH INSURANCE

## 2010-10-24 DIAGNOSIS — L0100 Impetigo, unspecified: Secondary | ICD-10-CM

## 2010-10-24 DIAGNOSIS — J9600 Acute respiratory failure, unspecified whether with hypoxia or hypercapnia: Secondary | ICD-10-CM

## 2010-10-24 LAB — BLOOD GAS, ARTERIAL
Bicarbonate: 30.7 mEq/L — ABNORMAL HIGH (ref 20.0–24.0)
FIO2: 0.3 %
Mode: POSITIVE
O2 Saturation: 94.2 %
Patient temperature: 98.6
Pressure support: 5 cmH2O
TCO2: 32.2 mmol/L (ref 0–100)
pH, Arterial: 7.402 (ref 7.350–7.450)

## 2010-10-24 LAB — POCT I-STAT 7, (LYTES, BLD GAS, ICA,H+H)
Acid-Base Excess: 8 mmol/L — ABNORMAL HIGH (ref 0.0–2.0)
Calcium, Ion: 1.1 mmol/L — ABNORMAL LOW (ref 1.12–1.32)
O2 Saturation: 99 %
Potassium: 4 mEq/L (ref 3.5–5.1)
Sodium: 141 mEq/L (ref 135–145)
pCO2 arterial: 42.6 mmHg (ref 35.0–45.0)

## 2010-10-24 LAB — GLUCOSE, CAPILLARY: Glucose-Capillary: 225 mg/dL — ABNORMAL HIGH (ref 70–99)

## 2010-10-25 ENCOUNTER — Inpatient Hospital Stay (HOSPITAL_COMMUNITY): Payer: PRIVATE HEALTH INSURANCE

## 2010-10-25 LAB — GLUCOSE, CAPILLARY
Glucose-Capillary: 208 mg/dL — ABNORMAL HIGH (ref 70–99)
Glucose-Capillary: 196 mg/dL — ABNORMAL HIGH (ref 70–99)
Glucose-Capillary: 181 mg/dL — ABNORMAL HIGH (ref 70–99)
Glucose-Capillary: 232 mg/dL — ABNORMAL HIGH (ref 70–99)
Glucose-Capillary: 221 mg/dL — ABNORMAL HIGH (ref 70–99)
Glucose-Capillary: 217 mg/dL — ABNORMAL HIGH (ref 70–99)

## 2010-10-25 LAB — BLOOD GAS, ARTERIAL
Acid-Base Excess: 6.3 mmol/L — ABNORMAL HIGH (ref 0.0–2.0)
Drawn by: 347621
FIO2: 0.6 %
MECHVT: 650 mL
O2 Saturation: 98.5 %
PEEP: 5 cmH2O
Patient temperature: 98.6
RATE: 15 resp/min

## 2010-10-25 LAB — BASIC METABOLIC PANEL
BUN: 16 mg/dL (ref 6–23)
CO2: 30 mEq/L (ref 19–32)
Calcium: 9.2 mg/dL (ref 8.4–10.5)
Chloride: 104 mEq/L (ref 96–112)
Creatinine, Ser: 0.65 mg/dL (ref 0.50–1.35)
GFR calc Af Amer: >90 mL/min (ref >90)
GFR calc non Af Amer: >90 mL/min (ref >90)
Glucose, Bld: 209 mg/dL — ABNORMAL HIGH (ref 70–99)
Potassium: 3.9 mEq/L (ref 3.5–5.1)
Sodium: 142 mEq/L (ref 135–145)

## 2010-10-26 ENCOUNTER — Inpatient Hospital Stay (HOSPITAL_COMMUNITY): Payer: PRIVATE HEALTH INSURANCE

## 2010-10-26 DIAGNOSIS — J9600 Acute respiratory failure, unspecified whether with hypoxia or hypercapnia: Secondary | ICD-10-CM

## 2010-10-26 DIAGNOSIS — L0100 Impetigo, unspecified: Secondary | ICD-10-CM

## 2010-10-26 LAB — BASIC METABOLIC PANEL
BUN: 17 mg/dL (ref 6–23)
CO2: 31 mEq/L (ref 19–32)
Chloride: 102 mEq/L (ref 96–112)
Glucose, Bld: 190 mg/dL — ABNORMAL HIGH (ref 70–99)
Potassium: 3.9 mEq/L (ref 3.5–5.1)

## 2010-10-26 LAB — CBC
HCT: 30 % — ABNORMAL LOW (ref 39.0–52.0)
Hemoglobin: 9.1 g/dL — ABNORMAL LOW (ref 13.0–17.0)
MCH: 23.8 pg — ABNORMAL LOW (ref 26.0–34.0)
MCHC: 30.3 g/dL (ref 30.0–36.0)
MCV: 78.3 fL (ref 78.0–100.0)

## 2010-10-26 LAB — GLUCOSE, CAPILLARY
Glucose-Capillary: 192 mg/dL — ABNORMAL HIGH (ref 70–99)
Glucose-Capillary: 246 mg/dL — ABNORMAL HIGH (ref 70–99)

## 2010-10-26 LAB — LIPID PANEL
LDL Cholesterol: 61 mg/dL (ref 0–99)
Total CHOL/HDL Ratio: 3.6 RATIO
VLDL: 13 mg/dL (ref 0–40)

## 2010-10-26 LAB — HEPATIC FUNCTION PANEL
ALT: 17 U/L (ref 0–53)
AST: 16 U/L (ref 0–37)
Albumin: 3.2 g/dL — ABNORMAL LOW (ref 3.5–5.2)
Bilirubin, Direct: <0.1 mg/dL (ref 0.0–0.3)
Total Bilirubin: 0.3 mg/dL (ref 0.3–1.2)

## 2010-10-26 LAB — URINALYSIS, ROUTINE W REFLEX MICROSCOPIC
Bilirubin Urine: NEGATIVE
Glucose, UA: NEGATIVE mg/dL
Ketones, ur: NEGATIVE mg/dL
Leukocytes, UA: NEGATIVE
Nitrite: NEGATIVE
Protein, ur: NEGATIVE mg/dL

## 2010-10-27 ENCOUNTER — Inpatient Hospital Stay (HOSPITAL_COMMUNITY): Payer: PRIVATE HEALTH INSURANCE

## 2010-10-27 DIAGNOSIS — M5000 Cervical disc disorder with myelopathy, unspecified cervical region: Secondary | ICD-10-CM

## 2010-10-27 LAB — COMPREHENSIVE METABOLIC PANEL
ALT: 22 U/L (ref 0–53)
AST: 17 U/L (ref 0–37)
CO2: 29 mEq/L (ref 19–32)
Chloride: 98 mEq/L (ref 96–112)
Creatinine, Ser: 0.56 mg/dL (ref 0.50–1.35)
GFR calc non Af Amer: >90 mL/min (ref >90)
Glucose, Bld: 171 mg/dL — ABNORMAL HIGH (ref 70–99)
Total Bilirubin: 0.3 mg/dL (ref 0.3–1.2)

## 2010-10-27 LAB — CBC
HCT: 30.8 % — ABNORMAL LOW (ref 39.0–52.0)
Hemoglobin: 9.7 g/dL — ABNORMAL LOW (ref 13.0–17.0)
MCH: 24.3 pg — ABNORMAL LOW (ref 26.0–34.0)
MCHC: 31.5 g/dL (ref 30.0–36.0)
MCV: 77 fL — ABNORMAL LOW (ref 78.0–100.0)
Platelets: 253 10*3/uL (ref 150–400)
RBC: 4 MIL/uL — ABNORMAL LOW (ref 4.22–5.81)
RDW: 15.6 % — ABNORMAL HIGH (ref 11.5–15.5)
WBC: 10.1 10*3/uL (ref 4.0–10.5)

## 2010-10-27 LAB — GLUCOSE, CAPILLARY
Glucose-Capillary: 175 mg/dL — ABNORMAL HIGH (ref 70–99)
Glucose-Capillary: 166 mg/dL — ABNORMAL HIGH (ref 70–99)
Glucose-Capillary: 198 mg/dL — ABNORMAL HIGH (ref 70–99)
Glucose-Capillary: 203 mg/dL — ABNORMAL HIGH (ref 70–99)

## 2010-10-28 LAB — GLUCOSE, CAPILLARY
Glucose-Capillary: 143 mg/dL — ABNORMAL HIGH (ref 70–99)
Glucose-Capillary: 164 mg/dL — ABNORMAL HIGH (ref 70–99)
Glucose-Capillary: 191 mg/dL — ABNORMAL HIGH (ref 70–99)

## 2010-10-28 LAB — BASIC METABOLIC PANEL
Chloride: 95 mEq/L — ABNORMAL LOW (ref 96–112)
GFR calc Af Amer: >90 mL/min (ref >90)
Potassium: 3.5 mEq/L (ref 3.5–5.1)

## 2010-10-28 LAB — CBC
Platelets: 281 10*3/uL (ref 150–400)
RBC: 4.17 MIL/uL — ABNORMAL LOW (ref 4.22–5.81)
WBC: 9.5 10*3/uL (ref 4.0–10.5)

## 2010-10-28 LAB — MAGNESIUM: Magnesium: 2.1 mg/dL (ref 1.5–2.5)

## 2010-10-29 LAB — GLUCOSE, CAPILLARY
Glucose-Capillary: 153 mg/dL — ABNORMAL HIGH (ref 70–99)
Glucose-Capillary: 227 mg/dL — ABNORMAL HIGH (ref 70–99)
Glucose-Capillary: 258 mg/dL — ABNORMAL HIGH (ref 70–99)
Glucose-Capillary: 265 mg/dL — ABNORMAL HIGH (ref 70–99)
Glucose-Capillary: 93 mg/dL (ref 70–99)

## 2010-10-29 NOTE — Op Note (Signed)
NAME:  Cody Cordova, Cody Cordova NO.:  [id number]  MEDICAL RECORD NO.:  [id number]  LOCATION:  3107                         FACILITY:  [facility]  PHYSICIAN:  Nelda Severe, MD      DATE OF BIRTH:  03-17-1970  DATE OF PROCEDURE: DATE OF DISCHARGE:                              OPERATIVE REPORT   Operation began on October 23, 2010, finished on October 24, 2010.  SURGEON:  Nelda Severe, MD  ASSISTANT:  Lianne Cure, PA-C  PREOPERATIVE DIAGNOSIS:  C6-7 disk herniation with myelopathy, status post disk excision and fusion with structural autograft with worsening lower extremity weakness in the immediate postoperative phase.  POSTOPERATIVE DIAGNOSIS: same  OPERATION: Re-exploration of previous anterior cervical decompression and fusion at C6-7; removal of previously place autograft, partial corpectomy C7/decompression of spinal canal; C6-7 fusion with moselized autograft and Titan interbody fusion device  CLINICAL NOTE:  This man was operated on the afternoon of October 23, 2010.  He was operated on for cervical myelopathy caused by a large cervical disk herniation.  The disk excision and fusion was performed and he wakened up with good upper extremity strength, very poor lower extremity strength, in fact almost complete loss of movement and sensation in both lower extremities.  He had a very small amount of dorsiflexion and plantar flexion of his toes on both sides at the time we returned him to the operating room.  The cervical MRI scan was performed and appeared to show retained disk with persistent cord compression.  Accordingly, we informed this family and advised strongly that he will be taken back to the operating room for further decompression.  His mother did sign the consent on his behalf.  The patient was intubated, but conscious and appeared understand what we were doing and so far he nodded his head, etc.  PROCEDURE NOTE:  He was then placed under general  endotracheal anesthesia, never having him extubated from the first procedure. Sequential compression devices were in place.  The Anesthesiology team placed an arterial line in the left upper extremity.  He was positioned supine on a flat-topped Jackson table.  The head and neck were mildly extended.  The head was supported on foam donut.  Stables were removed from the anterior cervical incision.  The anterior cervical spine was prepped with Betadine and draped in rectangular fashion.  The drapes were secured with Ioban.  The deep sutures were removed.  Shadow-Line retractors placed and the site of previous surgery at C6-7 visualized.  The Caspar pins were placed and distracted and the graft removed with a Kocher clamp without difficulty.  A small amount of clot was sucked out of the diskectomy site.  I concentrated mostly on the distal part of the decompressions that appeared to be where most of the residual disk herniation was that is posterior to the C7 body.  I removed approximately 7 mm of the upper portion of C7 and in fact producing a partial corpectomy.  The operating microscope was used for most of this.  No free fragment of disk was identified.  There appeared to be a large, inflamed, matted phlegmon, presumably attached to the dura more deeply.  I  interprets as being disk, which is scarring down to the dura.  Ultimately what we were able to do was to create more space for the cord and attached scarred disk to move forward.  I never was able to develop a plane between this phlegmon and the dura.  I felt that it was not prudent to pursue this to any great extent for fear of further aggravating his cord problem.  I had planned to remove the distal portion of the existing plate, which spans the distance from C3 to C6 and I was able to remove one of the distal screws easily.  I then encountered great difficulty removing the other distal screw and eventually decided to proceed with  the Titan Cage, 1.2 cm medium footprint, which fit very well and I felt confident not using any additional plate fixation.  I failed to remove the portion of the plate, which was in place, was going to extend the operation probably another 2 hours.  I did try briefly to use a metal-cutting bur to remove the locking screw within the head of the right-sided distal screw, this did not meet the success and I felt that the task of getting the plate, at least the distal part, was going to not add too much to the stability of the surgery in terms of being able to get another plate in.  The Titan Cage fit extremely well.  The carpentry was good.  The cage was packed with morselized fragments from the extricated structural autograft.  The wound was irrigated with antibiotic solution on multiple occasions.  We then placed a 7-gauge silicone rubber suction drain in the prevertebral area and closed the wound using 2-0 Vicryl in a deep tissues and staples in the skin.  The drain was secured with a 3-0 nylon suture, but nonetheless when the drapes removed it pulled out.  In view of the fact that there had been really minimal bleeding and that there were no continuous sutures in the closure, I elected to not re- prepp and drape and replace the drain, feeling that its placement was all optional to begin with.  At the time of dictation, the patient has not been awakened.  I have spoken with his family.  I have explained to them that we have done all we can do to decompress the spinal cord and that I did not find any retained fragments of disk, which of course is what I was anticipating at the time I taken back to the operating room;  however, I am confident that there is no significant free fragment of disk actually to be found. There were a few very small fragments found during the first surgery and again during the next surgery, but nothing to account for the significant compression.  Rather I  think it is inflamed and adherent disk, which is inextricably  attached to the dura.  There were no intraoperative complications.  The sponge and needle counts were correct.  Blood loss was less than 200 mL.     Nelda Severe, MD     MT/MEDQ  D:  10/24/2010  T:  10/24/2010  Job:  [id number]  Electronically Signed by Nelda Severe MD on 10/29/2010 06:05:26 AM

## 2010-10-29 NOTE — Op Note (Signed)
NAME:  Cody Cordova, Cody Cordova NO.:  [id number]  MEDICAL RECORD NO.:  [id number]  LOCATION:                                 FACILITY:  PHYSICIAN:  Nelda Severe, MD      DATE OF BIRTH:  05-06-1970  DATE OF PROCEDURE:  10/23/2010 DATE OF DISCHARGE:                              OPERATIVE REPORT   PREOPERATIVE DIAGNOSIS:  C6-C7 central disk herniation with cervical myelopathy and quadriparesis, status post C3 through C6 fusion.  POSTOPERATIVE DIAGNOSIS:  C6-C7 central disk herniation with cervical myelopathy and quadriparesis, status post C3 through C6 fusion.  OPERATIVE PROCEDURE:  Anterior decompression at C6-C7, fusion C6-C7 with autogenous right iliac crest structural autograft; harvest structural autograft, right anterior iliac crest.  SURGEON:  Nelda Severe, MD.  ASSISTANT:  OR staff.  CLINICAL NOTE:  The patient is morbidly obese.  He presented in the office over a week ago with quadriparesis, was admitted to the hospital, a large central disk herniation with significant cord compression was identified, he developed a suppurative impetigo and had to be canceled for his first attempted procedure.  He has been treated with intravenous and oral antibiotics.  He has been seen by Infectious Disease for consultation.  The Hospitalists have been following him.  He was deemed suitable for surgery at this time.  PROCEDURE NOTE:  The patient was placed under general endotracheal anesthesia.  He was positioned supine on a Jackson flat-top table.  His trunk was elevated slightly on the right for improved access to the right iliac crest.  The neck was mildly extended and supported on a foam doughnut.  Wrist __________ were placed for purposes of tracking on the arms during x-ray.  The anterior cervical area and right anterior iliac crest area were prepped with DuraPrep and draped in square fashion.  The drapes were secured with Ioban.  Prior to preparation, we had  taken a cross-table lateral radiograph with an 18-gauge spinal needle taped to the skin to try to judge correct position of skin incision.  We knew from this radiograph that the prospect of getting any satisfactory radiographs visualized in the C6-C7 level was negligible.  Therefore, no more radiographs were taken, especially in view of the fact that I was able to locate C6-C7 immediately subjacent to the plate on his anterior cervical spine.  A transverse incision was made on the left side from the midline to the anterior border of the sternocleidomastoid about 2 cm proximal to the manubrium sternum.  No platysma layer was identified.  The anterior border of the sternocleidomastoid was identified.  Blunt dissection was carried out to the deep cervical fascia into the immediate prevertebral fascia.  The previously placed plate was palpated and eventually visualized.  The esophagus and trachea were retracted to the right side and the carotid sheath to the left side.  The longus colli muscles were elevated and a Shadow-Line retractor with deep blades placed. Eventually, a Caspar pin was placed through a hole in the plate proximally and into the body of C7 distally and some distraction applied.  Very degenerate disk was denucleated and the endplates prepared using curettes.  There was a posterior  spondylophyte on the upper edge of C7.  Lacking a functional high-speed bur, i.e. curetted away bone and developed the plane posterior to the spondylophyte with a 3-0 Prolene curette.  The 2-mm Kerrison rongeur was then used to remove it.  There was a great deal of disk posteriorly, although not a great amount of free fragment disk, however, there was some.  I really think that ultimately, most of the disk was anterior to the posterior longitudinal ligament, because once I cleared tissue away back to the posterior longitudinal ligament and removed posterior longitudinal ligament backed the dura,  there was really no identifiable disk fragments beyond the confines of the posterior longitudinal ligament. The spinal canal was carefully probed with a very fine nerve hook.  I could not deliver any free fragments.  Once I felt that the decompression was satisfactory and the dura was visualized from right to left, I proceeded to harvest bone graft.  Retraction was relaxed on the anterior cervical wound.  An oblique incision was made adjacent to the midportion of the anterior iliac crest.  This is based on palpation.  There was at least 4 inches of adipose tissue between the skin and the first identifiable bone.  The insertion of the external oblique fascia was cleared off the superior aspect of the iliac crest and a very deep Taylor retractor placed.  I used a sagittal saw to cut a tricortical graft.  When an osteotome was used to cut the base of the tricortical graft and once I tried to remove it, it came out of the grip of the [place] which I was grasping it and never was able find it again and it is a very deep wound.  Therefore, another piece of tricortical graft was cut.  This turned out to be almost exactly the right size.  It was trimmed for length.  The depth of the intervertebral space was measured and was 2 cm.  The graft was trimmed so that it was 1.5 cm in depth.  It was then impacted into the C6-C7 disk space gently having applied a little more distraction through the Casper pins.  The Caspar pin distraction was then released once the graft was flushed with the anterior C6 vertebra.  The graft appeared to be grasped by the endplates.  We then placed a Penrose drain in the prevertebral space and sutured it to the skin and medial end of the wound.  The wound was then closed using inverted 0 Vicryl suture in what presumably had previously been the platysma layer and the skin was closed with staples.  On the bone graft harvest wound, an 8-inch Hemovac drain was placed  and brought out through the skin where it was secured with a 2-0 nylon.  The subcutaneous layer was closed using interrupted 0 Vicryl sutures.  The skin was closed with staples.  The blood loss was estimated at 200 mL.  There were no intraoperative complications.  The surgery was extremely difficulty due to the depth of both wounds.  At the time of dictation, I had been informed by the anesthesiologist that the patient is not extubatable.  Apparently, he is awake, but not making any effort to breathe.  He will be examined in the recovery room. He will be transferred to an ICU to be placed on a ventilator.     Nelda Severe, MD     MT/MEDQ  D:  10/23/2010  T:  10/23/2010  Job:  [id number]  Electronically Signed by Casimiro Needle  Gale Klar MD on 10/29/2010 06:00:29 AM

## 2010-10-30 LAB — CBC
HCT: 37.4 % — ABNORMAL LOW (ref 39.0–52.0)
MCH: 24.7 pg — ABNORMAL LOW (ref 26.0–34.0)
MCV: 75.1 fL — ABNORMAL LOW (ref 78.0–100.0)
RBC: 4.98 MIL/uL (ref 4.22–5.81)
WBC: 10.2 10*3/uL (ref 4.0–10.5)

## 2010-10-30 LAB — PREALBUMIN: Prealbumin: 29 mg/dL (ref 17.0–34.0)

## 2010-10-30 LAB — DIFFERENTIAL
Eosinophils Absolute: 0.1 10*3/uL (ref 0.0–0.7)
Lymphs Abs: 0.6 10*3/uL — ABNORMAL LOW (ref 0.7–4.0)
Monocytes Absolute: 0.7 10*3/uL (ref 0.1–1.0)
Monocytes Relative: 7 % (ref 3–12)
Neutro Abs: 8.8 10*3/uL — ABNORMAL HIGH (ref 1.7–7.7)
Neutrophils Relative %: 86 % — ABNORMAL HIGH (ref 43–77)

## 2010-10-30 LAB — GLUCOSE, CAPILLARY
Glucose-Capillary: 152 mg/dL — ABNORMAL HIGH (ref 70–99)
Glucose-Capillary: 292 mg/dL — ABNORMAL HIGH (ref 70–99)

## 2010-10-31 LAB — GLUCOSE, CAPILLARY
Glucose-Capillary: 193 mg/dL — ABNORMAL HIGH (ref 70–99)
Glucose-Capillary: 128 mg/dL — ABNORMAL HIGH (ref 70–99)
Glucose-Capillary: 127 mg/dL — ABNORMAL HIGH (ref 70–99)

## 2010-11-01 ENCOUNTER — Inpatient Hospital Stay (HOSPITAL_COMMUNITY)
Admission: RE | Admit: 2010-11-01 | Discharge: 2010-11-07 | DRG: 945 | Disposition: A | Discharge status: Other Acute Inpatient Hospital | Payer: PRIVATE HEALTH INSURANCE | Source: Other Acute Inpatient Hospital | Attending: Physical Medicine & Rehabilitation | Admitting: Physical Medicine & Rehabilitation

## 2010-11-01 DIAGNOSIS — E669 Obesity, unspecified: Secondary | ICD-10-CM

## 2010-11-01 DIAGNOSIS — K219 Gastro-esophageal reflux disease without esophagitis: Secondary | ICD-10-CM

## 2010-11-01 DIAGNOSIS — M4716 Other spondylosis with myelopathy, lumbar region: Secondary | ICD-10-CM

## 2010-11-01 DIAGNOSIS — I2510 Atherosclerotic heart disease of native coronary artery without angina pectoris: Secondary | ICD-10-CM

## 2010-11-01 DIAGNOSIS — J4489 Other specified chronic obstructive pulmonary disease: Secondary | ICD-10-CM

## 2010-11-01 DIAGNOSIS — Z8249 Family history of ischemic heart disease and other diseases of the circulatory system: Secondary | ICD-10-CM

## 2010-11-01 DIAGNOSIS — G8250 Quadriplegia, unspecified: Secondary | ICD-10-CM

## 2010-11-01 DIAGNOSIS — F3289 Other specified depressive episodes: Secondary | ICD-10-CM

## 2010-11-01 DIAGNOSIS — Z5189 Encounter for other specified aftercare: Secondary | ICD-10-CM

## 2010-11-01 DIAGNOSIS — D649 Anemia, unspecified: Secondary | ICD-10-CM

## 2010-11-01 DIAGNOSIS — E785 Hyperlipidemia, unspecified: Secondary | ICD-10-CM

## 2010-11-01 DIAGNOSIS — G4733 Obstructive sleep apnea (adult) (pediatric): Secondary | ICD-10-CM

## 2010-11-01 DIAGNOSIS — J449 Chronic obstructive pulmonary disease, unspecified: Secondary | ICD-10-CM

## 2010-11-01 DIAGNOSIS — Z794 Long term (current) use of insulin: Secondary | ICD-10-CM

## 2010-11-01 DIAGNOSIS — M4712 Other spondylosis with myelopathy, cervical region: Secondary | ICD-10-CM

## 2010-11-01 DIAGNOSIS — A4902 Methicillin resistant Staphylococcus aureus infection, unspecified site: Secondary | ICD-10-CM

## 2010-11-01 DIAGNOSIS — I2699 Other pulmonary embolism without acute cor pulmonale: Secondary | ICD-10-CM | POA: Diagnosis not present

## 2010-11-01 DIAGNOSIS — Z981 Arthrodesis status: Secondary | ICD-10-CM

## 2010-11-01 DIAGNOSIS — L989 Disorder of the skin and subcutaneous tissue, unspecified: Secondary | ICD-10-CM

## 2010-11-01 DIAGNOSIS — F329 Major depressive disorder, single episode, unspecified: Secondary | ICD-10-CM

## 2010-11-01 DIAGNOSIS — M5000 Cervical disc disorder with myelopathy, unspecified cervical region: Secondary | ICD-10-CM

## 2010-11-01 DIAGNOSIS — I10 Essential (primary) hypertension: Secondary | ICD-10-CM

## 2010-11-01 DIAGNOSIS — E1169 Type 2 diabetes mellitus with other specified complication: Secondary | ICD-10-CM

## 2010-11-01 DIAGNOSIS — L408 Other psoriasis: Secondary | ICD-10-CM

## 2010-11-01 LAB — URINALYSIS, ROUTINE W REFLEX MICROSCOPIC
Bilirubin Urine: NEGATIVE
Hgb urine dipstick: NEGATIVE
Ketones, ur: NEGATIVE mg/dL
Protein, ur: NEGATIVE mg/dL
Urobilinogen, UA: 0.2 mg/dL (ref 0.0–1.0)

## 2010-11-01 LAB — GLUCOSE, CAPILLARY
Glucose-Capillary: 125 mg/dL — ABNORMAL HIGH (ref 70–99)
Glucose-Capillary: 126 mg/dL — ABNORMAL HIGH (ref 70–99)
Glucose-Capillary: 109 mg/dL — ABNORMAL HIGH (ref 70–99)
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
## 2010-11-02 DIAGNOSIS — M4712 Other spondylosis with myelopathy, cervical region: Secondary | ICD-10-CM

## 2010-11-02 LAB — COMPREHENSIVE METABOLIC PANEL
Alkaline Phosphatase: 66 U/L (ref 39–117)
BUN: 24 mg/dL — ABNORMAL HIGH (ref 6–23)
Calcium: 9.2 mg/dL (ref 8.4–10.5)
GFR calc Af Amer: >90 mL/min (ref >90)
Glucose, Bld: 57 mg/dL — ABNORMAL LOW (ref 70–99)
Total Protein: 6.7 g/dL (ref 6.0–8.3)

## 2010-11-02 LAB — CBC
MCHC: 32.1 g/dL (ref 30.0–36.0)
RDW: 16 % — ABNORMAL HIGH (ref 11.5–15.5)

## 2010-11-02 LAB — GLUCOSE, CAPILLARY
Glucose-Capillary: 61 mg/dL — ABNORMAL LOW (ref 70–99)
Glucose-Capillary: 83 mg/dL (ref 70–99)
Glucose-Capillary: 62 mg/dL — ABNORMAL LOW (ref 70–99)
Glucose-Capillary: 154 mg/dL — ABNORMAL HIGH (ref 70–99)

## 2010-11-02 LAB — DIFFERENTIAL
Eosinophils Absolute: 0.1 10*3/uL (ref 0.0–0.7)
Lymphs Abs: 2 10*3/uL (ref 0.7–4.0)
Monocytes Absolute: 1 10*3/uL (ref 0.1–1.0)
Monocytes Relative: 11 % (ref 3–12)
Neutrophils Relative %: 65 % (ref 43–77)

## 2010-11-02 LAB — URINE CULTURE: Colony Count: NO GROWTH

## 2010-11-03 LAB — BASIC METABOLIC PANEL
BUN: 17 mg/dL (ref 6–23)
Chloride: 103 mEq/L (ref 96–112)
Creatinine, Ser: 0.65 mg/dL (ref 0.50–1.35)
GFR calc Af Amer: >90 mL/min (ref >90)
Glucose, Bld: 69 mg/dL — ABNORMAL LOW (ref 70–99)

## 2010-11-03 LAB — GLUCOSE, CAPILLARY
Glucose-Capillary: 59 mg/dL — ABNORMAL LOW (ref 70–99)
Glucose-Capillary: 161 mg/dL — ABNORMAL HIGH (ref 70–99)
Glucose-Capillary: 287 mg/dL — ABNORMAL HIGH (ref 70–99)

## 2010-11-03 NOTE — Consult Note (Signed)
NAME:  Cody Cordova, GROVER NO.:  [id number]  MEDICAL RECORD NO.:  [id number]  LOCATION:  5004                         FACILITY:  [facility]  PHYSICIAN:  Gardiner Barefoot, MD    DATE OF BIRTH:  1970/07/24  DATE OF CONSULTATION: DATE OF DISCHARGE:                                CONSULTATION   REASON FOR CONSULTATION:  Skin infections.  HISTORY OF PRESENT ILLNESS:  This is a 40 year old male with a history of diabetes, hypertension, COPD, and obesity, who came in after a fall and noted to have cervical myelopathy and weakness.  He has had progressive symptoms that include weakness, difficulty walking, and pain in the cervical area.  The patient has been admitted by the Neurosurgery Team and went down to the operating suite today for procedure; however, was noted that he had significant lesions over his body including his left shoulder and his abdomen where previous incision had been as well as his legs including his right shin.  He does state that the one on the shoulder had recently "popped" and had clear fluid draining out of it. Otherwise, he denies any fever or chills.  At this time though, it is difficult to get a full history as he is sleepy post medication.  His mother who is at the bedside also confirms the story.  PAST MEDICAL HISTORY: 1. Hypertension. 2. Diabetes. 3. Hyperlipidemia. 4. COPD. 5. Chronic rhinitis. 6. GERD. 7. Depression. 8. Nonobstructive coronary artery disease. 9. Obesity. 10.Venous insufficiency.  MEDICATIONS:  Currently, he is on vancomycin, which was started today after the culture was done.  ALLERGIES:  IODINE and SULFA.  SOCIAL HISTORY:  No history of alcohol, tobacco, or drug use.  FAMILY HISTORY:  Coronary artery disease in his father.  REVIEW OF SYSTEMS:  Twelve-point review of systems was obtained, is negative except as per the history of present illness.  PHYSICAL EXAMINATION:  VITAL SIGNS:  Temperature is 98.1, pulse  68, respirations 18, blood pressure is 146/73, and O2 sats 95% on room air. GENERAL:  The patient is awake, alert, although sleepy, in no acute distress. CARDIOVASCULAR:  Regular rate and rhythm.  No murmurs, rubs, or gallops. LUNGS:  Clear to auscultation bilaterally. ABDOMEN:  Soft, nontender, nondistended, and obese. EXTREMITIES:  No edema. SKIN:  With notable lesions, one on the left shoulder that is about 2 cm long and end is unroofed with the layer of skin missing, though does not appear significantly infected with no active pus or significant erythema surrounding this time.  Also, the mid abdominal incision, which is an old surgical incision does have what appears like an abrasion with the open skin as well and also 0.5-cm lesion on his right shin that also does appear consistent with impetigo.  LABORATORY DATA:  Gram stain of the area does show wbc's and gram- positive cocci with the culture pending.  MRSA screen is positive.  Last wbc from October 16, 2010 is 5.5, creatinine 0.88.  ASSESSMENT AND PLAN:  Impetigo.  Agree with the skin infections, do sound like impetigo and are opened up.  He is on vancomycin, and I agree with that management and to continue with vancomycin  pending culture. Without any real systemic symptoms, he will be able to continue therapy with p.o. antibiotics if it is sensitive to nonsulfa-containing p.o. antibiotics.  We will await the culture, which has been done which was done off antibiotics.  Thank you for the consult.     Gardiner Barefoot, MD     RWC/MEDQ  D:  10/18/2010  T:  10/18/2010  Job:  [id number]  Electronically Signed by Staci Righter MD on 11/03/2010 05:13:03 PM

## 2010-11-04 LAB — GLUCOSE, CAPILLARY
Glucose-Capillary: 147 mg/dL — ABNORMAL HIGH (ref 70–99)
Glucose-Capillary: 197 mg/dL — ABNORMAL HIGH (ref 70–99)

## 2010-11-05 DIAGNOSIS — M4712 Other spondylosis with myelopathy, cervical region: Secondary | ICD-10-CM

## 2010-11-05 LAB — GLUCOSE, CAPILLARY
Glucose-Capillary: 117 mg/dL — ABNORMAL HIGH (ref 70–99)
Glucose-Capillary: 103 mg/dL — ABNORMAL HIGH (ref 70–99)
Glucose-Capillary: 216 mg/dL — ABNORMAL HIGH (ref 70–99)

## 2010-11-06 DIAGNOSIS — M4712 Other spondylosis with myelopathy, cervical region: Secondary | ICD-10-CM

## 2010-11-06 LAB — GLUCOSE, CAPILLARY
Glucose-Capillary: 123 mg/dL — ABNORMAL HIGH (ref 70–99)
Glucose-Capillary: 149 mg/dL — ABNORMAL HIGH (ref 70–99)
Glucose-Capillary: 172 mg/dL — ABNORMAL HIGH (ref 70–99)

## 2010-11-07 ENCOUNTER — Inpatient Hospital Stay (HOSPITAL_COMMUNITY): Payer: PRIVATE HEALTH INSURANCE

## 2010-11-07 ENCOUNTER — Inpatient Hospital Stay (HOSPITAL_COMMUNITY)
Admission: AD | Admit: 2010-11-07 | Discharge: 2010-11-10 | DRG: 176 | Disposition: A | Discharge status: Rehabilitation Facility | Payer: PRIVATE HEALTH INSURANCE | Source: Ambulatory Visit | Attending: Internal Medicine | Admitting: Internal Medicine

## 2010-11-07 DIAGNOSIS — E041 Nontoxic single thyroid nodule: Secondary | ICD-10-CM | POA: Diagnosis present

## 2010-11-07 DIAGNOSIS — Z79899 Other long term (current) drug therapy: Secondary | ICD-10-CM

## 2010-11-07 DIAGNOSIS — J4489 Other specified chronic obstructive pulmonary disease: Secondary | ICD-10-CM | POA: Diagnosis present

## 2010-11-07 DIAGNOSIS — I2699 Other pulmonary embolism without acute cor pulmonale: Principal | ICD-10-CM | POA: Diagnosis present

## 2010-11-07 DIAGNOSIS — E669 Obesity, unspecified: Secondary | ICD-10-CM | POA: Diagnosis present

## 2010-11-07 DIAGNOSIS — F3289 Other specified depressive episodes: Secondary | ICD-10-CM | POA: Diagnosis present

## 2010-11-07 DIAGNOSIS — J449 Chronic obstructive pulmonary disease, unspecified: Secondary | ICD-10-CM | POA: Diagnosis present

## 2010-11-07 DIAGNOSIS — Z794 Long term (current) use of insulin: Secondary | ICD-10-CM

## 2010-11-07 DIAGNOSIS — I10 Essential (primary) hypertension: Secondary | ICD-10-CM | POA: Diagnosis present

## 2010-11-07 DIAGNOSIS — I2510 Atherosclerotic heart disease of native coronary artery without angina pectoris: Secondary | ICD-10-CM | POA: Diagnosis present

## 2010-11-07 DIAGNOSIS — K219 Gastro-esophageal reflux disease without esophagitis: Secondary | ICD-10-CM | POA: Diagnosis present

## 2010-11-07 DIAGNOSIS — I872 Venous insufficiency (chronic) (peripheral): Secondary | ICD-10-CM | POA: Diagnosis present

## 2010-11-07 DIAGNOSIS — E7800 Pure hypercholesterolemia, unspecified: Secondary | ICD-10-CM | POA: Diagnosis present

## 2010-11-07 DIAGNOSIS — E119 Type 2 diabetes mellitus without complications: Secondary | ICD-10-CM | POA: Diagnosis present

## 2010-11-07 DIAGNOSIS — F329 Major depressive disorder, single episode, unspecified: Secondary | ICD-10-CM | POA: Diagnosis present

## 2010-11-07 LAB — COMPREHENSIVE METABOLIC PANEL
AST: 12 U/L (ref 0–37)
Albumin: 3.1 g/dL — ABNORMAL LOW (ref 3.5–5.2)
Alkaline Phosphatase: 82 U/L (ref 39–117)
CO2: 29 mEq/L (ref 19–32)
Chloride: 102 mEq/L (ref 96–112)
Creatinine, Ser: 0.61 mg/dL (ref 0.50–1.35)
GFR calc non Af Amer: >90 mL/min (ref >90)
Potassium: 3.7 mEq/L (ref 3.5–5.1)
Total Bilirubin: 0.4 mg/dL (ref 0.3–1.2)

## 2010-11-07 LAB — CBC
HCT: 30.9 % — ABNORMAL LOW (ref 39.0–52.0)
Hemoglobin: 9.5 g/dL — ABNORMAL LOW (ref 13.0–17.0)
MCHC: 30.7 g/dL (ref 30.0–36.0)
MCV: 79.8 fL (ref 78.0–100.0)
RDW: 17.2 % — ABNORMAL HIGH (ref 11.5–15.5)
WBC: 8.5 10*3/uL (ref 4.0–10.5)

## 2010-11-07 LAB — APTT: aPTT: 36 seconds (ref 24–37)

## 2010-11-07 LAB — GLUCOSE, CAPILLARY
Glucose-Capillary: 131 mg/dL — ABNORMAL HIGH (ref 70–99)
Glucose-Capillary: 97 mg/dL (ref 70–99)

## 2010-11-07 LAB — CK TOTAL AND CKMB (NOT AT ARMC)
CK, MB: 4 ng/mL (ref 0.3–4.0)
Total CK: 39 U/L (ref 7–232)

## 2010-11-07 MED ORDER — XENON XE 133 GAS
10.0000 | GAS_FOR_INHALATION | Freq: Once | RESPIRATORY_TRACT | Status: AC | PRN
  Administered 2010-11-07: 10 via RESPIRATORY_TRACT

## 2010-11-07 MED ORDER — TECHNETIUM TO 99M ALBUMIN AGGREGATED
6.0000 | Freq: Once | INTRAVENOUS | Status: AC | PRN
  Administered 2010-11-07: 6 via INTRAVENOUS

## 2010-11-08 ENCOUNTER — Encounter (HOSPITAL_COMMUNITY): Payer: Self-pay | Admitting: Radiology

## 2010-11-08 ENCOUNTER — Inpatient Hospital Stay (HOSPITAL_COMMUNITY): Payer: PRIVATE HEALTH INSURANCE

## 2010-11-08 LAB — LIPASE, BLOOD: Lipase: 17 U/L (ref 11–59)

## 2010-11-08 LAB — LIPID PANEL
Cholesterol: 92 mg/dL (ref 0–200)
LDL Cholesterol: 51 mg/dL (ref 0–99)
Total CHOL/HDL Ratio: 3.3 RATIO
VLDL: 13 mg/dL (ref 0–40)

## 2010-11-08 LAB — COMPREHENSIVE METABOLIC PANEL WITH GFR
ALT: 23 U/L (ref 0–53)
AST: 15 U/L (ref 0–37)
Albumin: 2.8 g/dL — ABNORMAL LOW (ref 3.5–5.2)
Alkaline Phosphatase: 83 U/L (ref 39–117)
BUN: 9 mg/dL (ref 6–23)
CO2: 28 meq/L (ref 19–32)
Calcium: 9 mg/dL (ref 8.4–10.5)
Chloride: 104 meq/L (ref 96–112)
Creatinine, Ser: 0.55 mg/dL (ref 0.50–1.35)
GFR calc Af Amer: >90 mL/min
GFR calc non Af Amer: >90 mL/min
Glucose, Bld: 93 mg/dL (ref 70–99)
Potassium: 3.4 meq/L — ABNORMAL LOW (ref 3.5–5.1)
Sodium: 138 meq/L (ref 135–145)
Total Bilirubin: 0.5 mg/dL (ref 0.3–1.2)
Total Protein: 6 g/dL (ref 6.0–8.3)

## 2010-11-08 LAB — GLUCOSE, CAPILLARY
Glucose-Capillary: 129 mg/dL — ABNORMAL HIGH (ref 70–99)
Glucose-Capillary: 221 mg/dL — ABNORMAL HIGH (ref 70–99)
Glucose-Capillary: 245 mg/dL — ABNORMAL HIGH (ref 70–99)
Glucose-Capillary: 294 mg/dL — ABNORMAL HIGH (ref 70–99)

## 2010-11-08 LAB — CARDIAC PANEL(CRET KIN+CKTOT+MB+TROPI)
CK, MB: 4.6 ng/mL — ABNORMAL HIGH (ref 0.3–4.0)
Total CK: 37 U/L (ref 7–232)
Troponin I: <0.3 ng/mL (ref <0.30)

## 2010-11-08 LAB — HEPARIN LEVEL (UNFRACTIONATED)
Heparin Unfractionated: 0.67 [IU]/mL (ref 0.30–0.70)
Heparin Unfractionated: 0.59 IU/mL (ref 0.30–0.70)

## 2010-11-08 LAB — CBC
HCT: 29.8 % — ABNORMAL LOW (ref 39.0–52.0)
MCH: 24.7 pg — ABNORMAL LOW (ref 26.0–34.0)
MCHC: 30.5 g/dL (ref 30.0–36.0)
MCV: 80.8 fL (ref 78.0–100.0)
Platelets: 225 10*3/uL (ref 150–400)
RDW: 17.1 % — ABNORMAL HIGH (ref 11.5–15.5)
WBC: 6.4 10*3/uL (ref 4.0–10.5)

## 2010-11-08 LAB — HEMOGLOBIN A1C
Hgb A1c MFr Bld: 7.1 % — ABNORMAL HIGH
Mean Plasma Glucose: 157 mg/dL — ABNORMAL HIGH

## 2010-11-08 MED ORDER — IOHEXOL 350 MG/ML SOLN
100.0000 mL | Freq: Once | INTRAVENOUS | Status: AC | PRN
  Administered 2010-11-08: 100 mL via INTRAVENOUS

## 2010-11-08 MED ORDER — IOHEXOL 300 MG/ML  SOLN
100.0000 mL | Freq: Once | INTRAMUSCULAR | Status: AC | PRN
  Administered 2010-11-08: 100 mL via INTRAVENOUS
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
## 2010-11-09 LAB — CBC
HCT: 30.1 % — ABNORMAL LOW (ref 39.0–52.0)
MCH: 24.4 pg — ABNORMAL LOW (ref 26.0–34.0)
MCV: 79.8 fL (ref 78.0–100.0)
Platelets: 223 10*3/uL (ref 150–400)
RBC: 3.77 MIL/uL — ABNORMAL LOW (ref 4.22–5.81)
RDW: 16.9 % — ABNORMAL HIGH (ref 11.5–15.5)
WBC: 8.6 10*3/uL (ref 4.0–10.5)

## 2010-11-09 LAB — BASIC METABOLIC PANEL
CO2: 28 mEq/L (ref 19–32)
Calcium: 9.1 mg/dL (ref 8.4–10.5)
Chloride: 104 mEq/L (ref 96–112)
Glucose, Bld: 157 mg/dL — ABNORMAL HIGH (ref 70–99)
Sodium: 140 mEq/L (ref 135–145)

## 2010-11-09 LAB — HEPARIN LEVEL (UNFRACTIONATED): Heparin Unfractionated: 0.78 IU/mL — ABNORMAL HIGH (ref 0.30–0.70)

## 2010-11-09 LAB — GLUCOSE, CAPILLARY
Glucose-Capillary: 93 mg/dL (ref 70–99)
Glucose-Capillary: 104 mg/dL — ABNORMAL HIGH (ref 70–99)
Glucose-Capillary: 254 mg/dL — ABNORMAL HIGH (ref 70–99)

## 2010-11-09 LAB — DIFFERENTIAL
Eosinophils Absolute: 0 10*3/uL (ref 0.0–0.7)
Eosinophils Relative: 0 % (ref 0–5)
Lymphocytes Relative: 16 % (ref 12–46)
Lymphs Abs: 1.3 10*3/uL (ref 0.7–4.0)
Monocytes Relative: 8 % (ref 3–12)

## 2010-11-09 LAB — PROTIME-INR: INR: 1.21 (ref 0.00–1.49)

## 2010-11-09 NOTE — H&P (Signed)
NAME:  Cody Cordova, Cody Cordova NO.:  [id number]  MEDICAL RECORD NO.:  [id number]  LOCATION:                                 FACILITY:  PHYSICIAN:  Erick Colace, M.D.DATE OF BIRTH:  Mar 09, 1970  DATE OF ADMISSION: DATE OF DISCHARGE:                             HISTORY & PHYSICAL   REASON FOR ADMISSION:  Cervical myelopathy.  A 40 year old male who was admitted with progressive quadriparesis on October 13, 2010.  X-ray imaging showed a large central disk herniation at C6-7.  Surgery was delayed secondary to multiple skin lesions.  ID cultured one of his wounds with positive MRSA and was placed on IV vanco with contact precautions.  He was cleared for surgery.  He underwent anterior decompression at C6-C7 with fusion on October 23, 2010 per Dr. Nelda Severe.  He is to wear Aspen collar at all times.  Decadron protocol.  IV vanco course has completed.  Because of slow progress with functional mobility, Physical Medicine Rehabilitation was consulted on October 27, 2010.  The patient was felt to be good rehab candidate and inpatient admission was pursued.  REVIEW OF SYSTEMS:  Positive for neck pain, posterior.  He has some weakness in the left lower extremity, that he still reports but overall his strength has improved.  He denies any numbness or tingling in his extremities anymore.  Other review of systems positive for reflux and depression, otherwise negative.  PAST MEDICAL HISTORY: 1. Obstructive sleep apnea 2. COPD. 3. GERD. 4. Psoriasis. 5. Hypertension. 6. Diabetes mellitus type 2. 7. Hyperlipidemia. 8. Depression.  PAST SURGICAL HISTORY:  ACDF in 2008, right femur fixation in 1993, and left shoulder surgery in 2004.  FAMILY HISTORY:  Positive for CAD.  SOCIAL HISTORY:  Currently lives with his mother who can assist as needed.  One-level home with 3 steps to enter.  FUNCTIONAL HISTORY:  Independent with walker prior to admission.  HOME  MEDICATIONS: 1. Fluticasone topical b.i.d. 2. Lexapro 20 mg daily. 3. Remeron 15 at bedtime. 4. Ketoconazole topical every 3 days. 5. Spiriva 18 mcg daily. 6. Opana 30 mg q.6 h. 7. Seroquel 300 mg daily. 8. Coreg 12.5 b.i.d. 9. Robaxin b.i.d. 10.Norvasc 5 mg p.o. daily. 11.Lipitor 20 mg p.o. daily. 12.Lantus 80 units subcu b.i.d. 13.NovoLog 10-50 units subcu t.i.d. 14.Singulair 10 mg daily. 15.Lasix 40 mg 1-2 p.o. daily. 16.Aldactone 25 mg p.o. daily. 17.Norco 10/325 q.6 h. p.r.n. 18.Nexium 40 mg p.o. daily. 19.KCl 20 mEq daily. 20.Clarinex 10 mg daily. 21.Advair inhaler b.i.d.  LABORATORY DATA:  Last BUN 20, creatinine 0.5, sodium 135, potassium 3.5.  Hemoglobin 12.3, white count 10.0, platelets 328,000, hemoglobin A1c of 7.6.  PHYSICAL EXAMINATION:  VITAL SIGNS:  This examination, blood pressure 99/68, pulse 60, respirations 18, temp 97.4. GENERAL:  Morbidly obese male.  No acute distress. HEENT:  Eyes anicteric, noninjected.  External ENT normal. NECK:  Has cervical collar on.  Did not check range of motion.  ACDF site is clean, dry, and intact. SKIN:  Multiple psoriatic regions in arms and legs.  No open areas. RESPIRATORY:  Effort is good. LUNGS:  Clear. HEART:  Regular rate and rhythm.  No rubs, murmurs,  or extra sounds. ABDOMEN:  Positive bowel sounds, soft, nontender to palpation. EXTREMITIES:  He is noted to have multiple skin areas. NEUROLOGIC:  His motor strength is 5/5 bilateral deltoid, biceps, triceps, grip.  Right lower extremity is 4/5 in the hip flexor, knee extensor, and ankle dorsiflexor.  Left side is 3-/5 in the hip flexor, knee extensor, and ankle dorsiflexor.  Sensory exam is normal to light touch.  Mood, memory, orientation and judgment are all intact.  POST ADMISSION PHYSICIAN EVALUATION: 1. Functional deficits secondary to cervical myelopathy, status post     decompression of C6-7 herniated nucleus pulposus, postoperative day     #9. 2. The  patient was admitted to receive collaborative interdisciplinary     care between the physiatrist, rehab nursing staff, and therapy     team. 3. The patient's level of medical complexity and substantial therapy     needs in context of that medical necessity cannot be provided at a     lesser intensity of care. 4. The patient has experienced substantial functional loss from his     baseline.  Upon functional assessment at the time of preadmission     screening, the patient had evaluation pending.  Upon functional     assessment today, the patient was at a +2 total assist 50% with bed     mobility, +2 total 60%-75% for transfers, mod assist 25 feet with     rolling walker ambulation, mod-to-max assist upper body bathing and     dressing, total assist lower body bathing and dressing, mod assist     grooming, total assist +2 chair lift or total transfers.  Judging     by the patient's diagnosis, physical exam, and functional history,     the patient has the potential for functional progress, which will     result in measurable gains while in inpatient rehab.  These gains     will be of substantial and practical use upon discharge to home in     facilitating mobility, self-care, and independence.  Interim change     in medical status since preadmission screening are detailed in the     history of present illness. 5. Physiatrist will provide 24-hour management in medical needs as     well as oversight of therapy plan/treatment, and provide guidance     appropriate regarding interaction of the two. 6. A 24-hour rehab nursing will assess in management of skin, bowel,     bladder, and help integrate therapy, concepts, techniques     education. 7. PT will assess and treat for pre-gait training, gait training,     endurance, mobility, equipment, safety.  Goals are for a     supervision level with mobility. 8. OT will assess and treat for ADLs, equipment, safety with neck     precautions,  equipment.  Goals are for a supervision level with     upper body and min assist lower body ADLs. 9. Case management and social work will assess and treat for     psychosocial issues and discharge planning. 10.Team conference will be held weekly to assess the patient     progress/goals and to determine barriers to discharge. 11.The patient has demonstrated sufficient medical stability and     exercise capacity to tolerate at least 3 hours of therapy per day     at least 5 days per week. 12.Estimated length of stay is 3 weeks.  Prognosis for further     functional improvement  is good.  MEDICAL PROBLEM LIST AND PLAN: 1. Deep venous thrombosis prophylaxis.  SCD boots. 2. Insulin-dependent diabetes mellitus.  Continue Lantus 40 units     subcu b.i.d.  CBGs ac and at bedtime. 3. Obstructive sleep apnea, on CPAP. 4. Hypertension.  Coreg, Aldactone, and Norvasc.  Monitor with     aggressive activity. 5. Depression.  Seroquel, Remeron, and Lexapro. 6. Chronic obstructive pulmonary disease.  Advair, Singulair, and     Spiriva.  O2 sat every shift.  Monitor during therapy as well. 7. Hyperlipidemia, on Lipitor. 8. Psoriasis.  Multiple topical creams. 9. Methicillin-resistant Staphylococcus aureus.  Contact precautions. Motivation and mood appeared to be adequate for full participation in inpatient rehabilitation program.  Rehab Medicine Services discussed with the patient, agrees with plan.     Erick Colace, M.D.     AEK/MEDQ  D:  11/01/2010  T:  11/02/2010  Job:  [id number]  cc:   Nelda Severe, MD Dr. Ara Kussmaul, MD  Electronically Signed by Claudette Laws M.D. on 11/09/2010 12:32:29 PM

## 2010-11-10 ENCOUNTER — Inpatient Hospital Stay (HOSPITAL_COMMUNITY)
Admission: RE | Admit: 2010-11-10 | Discharge: 2010-11-19 | DRG: 945 | Disposition: A | Discharge status: Home Health | Payer: PRIVATE HEALTH INSURANCE | Source: Ambulatory Visit | Attending: Physical Medicine & Rehabilitation | Admitting: Physical Medicine & Rehabilitation

## 2010-11-10 DIAGNOSIS — M4712 Other spondylosis with myelopathy, cervical region: Secondary | ICD-10-CM | POA: Diagnosis present

## 2010-11-10 DIAGNOSIS — A4902 Methicillin resistant Staphylococcus aureus infection, unspecified site: Secondary | ICD-10-CM

## 2010-11-10 DIAGNOSIS — G959 Disease of spinal cord, unspecified: Secondary | ICD-10-CM

## 2010-11-10 DIAGNOSIS — K219 Gastro-esophageal reflux disease without esophagitis: Secondary | ICD-10-CM | POA: Diagnosis present

## 2010-11-10 DIAGNOSIS — D62 Acute posthemorrhagic anemia: Secondary | ICD-10-CM

## 2010-11-10 DIAGNOSIS — J4489 Other specified chronic obstructive pulmonary disease: Secondary | ICD-10-CM | POA: Diagnosis present

## 2010-11-10 DIAGNOSIS — G4733 Obstructive sleep apnea (adult) (pediatric): Secondary | ICD-10-CM | POA: Diagnosis present

## 2010-11-10 DIAGNOSIS — J449 Chronic obstructive pulmonary disease, unspecified: Secondary | ICD-10-CM

## 2010-11-10 DIAGNOSIS — Z5189 Encounter for other specified aftercare: Principal | ICD-10-CM

## 2010-11-10 DIAGNOSIS — I10 Essential (primary) hypertension: Secondary | ICD-10-CM

## 2010-11-10 DIAGNOSIS — E785 Hyperlipidemia, unspecified: Secondary | ICD-10-CM | POA: Diagnosis present

## 2010-11-10 DIAGNOSIS — Z794 Long term (current) use of insulin: Secondary | ICD-10-CM

## 2010-11-10 DIAGNOSIS — I2699 Other pulmonary embolism without acute cor pulmonale: Secondary | ICD-10-CM

## 2010-11-10 DIAGNOSIS — Z981 Arthrodesis status: Secondary | ICD-10-CM

## 2010-11-10 DIAGNOSIS — E119 Type 2 diabetes mellitus without complications: Secondary | ICD-10-CM

## 2010-11-10 DIAGNOSIS — Z86718 Personal history of other venous thrombosis and embolism: Secondary | ICD-10-CM

## 2010-11-10 DIAGNOSIS — G8220 Paraplegia, unspecified: Secondary | ICD-10-CM

## 2010-11-10 LAB — BASIC METABOLIC PANEL
BUN: 9 mg/dL (ref 6–23)
Calcium: 9.1 mg/dL (ref 8.4–10.5)
Creatinine, Ser: 0.64 mg/dL (ref 0.50–1.35)
GFR calc Af Amer: >90 mL/min (ref >90)
GFR calc non Af Amer: >90 mL/min (ref >90)
Glucose, Bld: 67 mg/dL — ABNORMAL LOW (ref 70–99)
Potassium: 3.3 mEq/L — ABNORMAL LOW (ref 3.5–5.1)

## 2010-11-10 LAB — CBC
MCH: 24.8 pg — ABNORMAL LOW (ref 26.0–34.0)
MCHC: 30.5 g/dL (ref 30.0–36.0)
MCV: 81.3 fL (ref 78.0–100.0)
Platelets: 201 10*3/uL (ref 150–400)
RDW: 17.2 % — ABNORMAL HIGH (ref 11.5–15.5)

## 2010-11-10 LAB — GLUCOSE, CAPILLARY
Glucose-Capillary: 56 mg/dL — ABNORMAL LOW (ref 70–99)
Glucose-Capillary: 70 mg/dL (ref 70–99)

## 2010-11-10 LAB — PROTIME-INR: Prothrombin Time: 16.1 seconds — ABNORMAL HIGH (ref 11.6–15.2)

## 2010-11-11 DIAGNOSIS — G8220 Paraplegia, unspecified: Secondary | ICD-10-CM

## 2010-11-11 DIAGNOSIS — I2699 Other pulmonary embolism without acute cor pulmonale: Secondary | ICD-10-CM

## 2010-11-11 DIAGNOSIS — M4712 Other spondylosis with myelopathy, cervical region: Secondary | ICD-10-CM

## 2010-11-11 LAB — GLUCOSE, CAPILLARY
Glucose-Capillary: 102 mg/dL — ABNORMAL HIGH (ref 70–99)
Glucose-Capillary: 56 mg/dL — ABNORMAL LOW (ref 70–99)
Glucose-Capillary: 137 mg/dL — ABNORMAL HIGH (ref 70–99)

## 2010-11-11 LAB — COMPREHENSIVE METABOLIC PANEL
AST: 11 U/L (ref 0–37)
Albumin: 3 g/dL — ABNORMAL LOW (ref 3.5–5.2)
Calcium: 9.1 mg/dL (ref 8.4–10.5)
Chloride: 105 mEq/L (ref 96–112)
Creatinine, Ser: 0.62 mg/dL (ref 0.50–1.35)
Total Bilirubin: 0.2 mg/dL — ABNORMAL LOW (ref 0.3–1.2)
Total Protein: 6.2 g/dL (ref 6.0–8.3)

## 2010-11-11 LAB — CBC
MCV: 81 fL (ref 78.0–100.0)
Platelets: 171 10*3/uL (ref 150–400)
RBC: 3.94 MIL/uL — ABNORMAL LOW (ref 4.22–5.81)
WBC: 6.3 10*3/uL (ref 4.0–10.5)

## 2010-11-11 LAB — DIFFERENTIAL
Eosinophils Absolute: 0.2 10*3/uL (ref 0.0–0.7)
Lymphocytes Relative: 24 % (ref 12–46)
Lymphs Abs: 1.5 10*3/uL (ref 0.7–4.0)
Neutrophils Relative %: 65 % (ref 43–77)

## 2010-11-11 LAB — PROTIME-INR
INR: 1.72 — ABNORMAL HIGH (ref 0.00–1.49)
Prothrombin Time: 20.5 seconds — ABNORMAL HIGH (ref 11.6–15.2)

## 2010-11-11 MED ORDER — CALCIPOTRIENE-BETAMETH DIPROP 0.005-0.064 % EX OINT
TOPICAL_OINTMENT | Freq: Every day | CUTANEOUS | Status: DC
  Administered 2010-11-14 – 2010-11-16 (×3): via TOPICAL
  Administered 2010-11-18: 1 via TOPICAL
  Administered 2010-11-19: 09:00:00 via TOPICAL

## 2010-11-11 NOTE — Discharge Summary (Signed)
NAME:  CARRON, JAGGI NO.:  [id number]  MEDICAL RECORD NO.:  [id number]  LOCATION:  2924                         FACILITY:  [facility]  PHYSICIAN:  Ranelle Oyster, M.D.DATE OF BIRTH:  [birth year]  DATE OF ADMISSION:  11/07/2010 DATE OF DISCHARGE:  11/07/2010                              DISCHARGE SUMMARY   DISCHARGE DIAGNOSES: 1. Cervical myelopathy status post decompression cervical C6-7. 2. Insulin-dependent diabetes mellitus. 3. Obstructive sleep apnea. 4. Hypertension. 5. Depression. 6. Chronic obstructive pulmonary disease. 7. Hyperlipidemia. 8. Methicillin-resistant Staph aureus.  A 40 year old black male admitted with progressive quadriparesis since October 13, 2010.  X-ray showed a large central disk herniation cervical C6-7.  Surgery was delayed secondary to multiple skin lesions. Infectious Disease cultured one of these wounds, it was positive for MRSA and placed on intravenous vancomycin and contact precautions.  He was cleared for surgery and underwent anterior decompression cervical C6- 7 with fusion on October 23, 2010, by Dr. Alveda Reasons.  He was put on an Aspen collar at all times.  He received Decadron protocol.  He was admitted for comprehensive rehab program.  PAST MEDICAL HISTORY:  See discharge diagnoses.  SOCIAL HISTORY:  He lives with his mother who can assist on discharge. One-level home, 3 steps to entry.  FUNCTIONAL HISTORY:  Prior to admission was independent with a walker.  HOME MEDICATIONS:  Lexapro, Remeron, Spiriva, Opana 30 mg every 6 hours, Seroquel, Coreg, Norvasc, Lipitor, Lantus insulin 80 units twice daily, NovoLog 10-50 units 3 times daily, Lasix 40 mg 1-2 tablets daily, Aldactone 25 mg daily, Norco every 6 hours as needed, Advair inhaler.  PHYSICAL EXAMINATION:  VITAL SIGNS:  Blood pressure 99/68, pulse 60, respirations 18, temperature 97.4. GENERAL:  This was an alert male in no acute distress.  Morbidly obese. LUNGS:   Clear to auscultation. CARDIAC:  Regular rate and rhythm. ABDOMEN:  Soft, nontender.  Good bowel sounds. EXTREMITIES:  Right lower extremity 4/5 in hip flexors, left side 3-/5 in hip flexors and knee extensors.  The patient was admitted for comprehensive rehab program.  REHABILITATION HOSPITAL COURSE:  The patient was admitted to inpatient rehab services with therapies initiated on a 3-hour daily basis consisting of physical therapy, occupational therapy, and rehabilitation nursing.  The following issues were addressed during the patient's rehabilitation stay.  Pertaining to Mr. Quaranta's cervical myelopathy, he had undergone decompression C6-7 per Dr. Alveda Reasons, cervical collar at all times.  Sequential compression devices were used for deep vein thrombosis prophylaxis.  His blood sugars were monitored on Lantus insulin.  He continued on CPAP for obstructive sleep apnea; however, he did refuse this at time.  Blood pressure is controlled on Coreg, Aldactone, and Norvasc.  He did have a long history of depression on Seroquel, Remeron, Lexapro with emotional support provided.  Ongoing nebulizer treatments for chronic obstructive pulmonary disease with oxygen saturations greater than 90% on room air.  He was not using any oxygen.  He remained on contact precautions for methicillin-resistant Staph aureus wound.  He remained afebrile.  The patient received weekly collaborative interdisciplinary team conferences to discuss estimated length of stay, family teaching, and any barriers to discharge.  He continued to  progress nicely with his therapies; however, on November 07, 2010, nonspecific chest discomfort.  He did receive nitroglycerin x3. EKG shows sinus rhythm, possible mild left axis deviation.  Intervals appeared to be otherwise normal.  No concerning ST changes.  There were a few PVCs.  Due to his chest pain, he was transferred to acute care services for ongoing monitor.  A pulmonary  ventilation perfusion scan showed intermediate for pulmonary emboli.  A CT angiogram of the chest was ordered, it was pending upon this time of transfer.  His condition was guarded.  All issues had been discussed with family.  Cardiac panel was unremarkable except a CK-MB of 4.6.  All medication changes were made in regards to [facility].     Mariam Dollar, P.A.   ______________________________ Ranelle Oyster, M.D.    DA/MEDQ  D:  11/08/2010  T:  11/08/2010  Job:  [id number]

## 2010-11-11 NOTE — H&P (Signed)
NAME:  Cody Cordova, Cody Cordova NO.:  [id number]  MEDICAL RECORD NO.:  [id number]  LOCATION:  4003                         FACILITY:  [facility]  PHYSICIAN:  Ranelle Oyster, M.D.DATE OF BIRTH:  [birth year]  DATE OF ADMISSION:  11/10/2010 DATE OF DISCHARGE:                             HISTORY & PHYSICAL   CHIEF COMPLAINT:  Weakness, left side greater than right.  HISTORY OF PRESENT ILLNESS:  Please see prior rehab H and P for details leading up to transfer.  While on rehab, this past weekend, on November 07, 2010, he developed chest pain.  CT angio of the chest showed pulmonary emboli to the branches of the right upper lobe and lower lobe. He was placed on subcu Lovenox with Coumadin.  Cardiac panel was negative.  Rehab was followed up with the patient and felt in conjunction with his primary team that he was ready to return to the rehab unit today.  REVIEW OF SYSTEMS:  Notable for some shortness of breath, weakness, numbness.  Other pertinent positives as above and full 12-point review is in the written H and P.  Past medical history, family history, social history, medications and allergies were all the same as in the initial H and P.  LABORATORY DATA:  Hemoglobin 9.9, white count 8000, platelets 201. Sodium 142, potassium 3.3, BUN 9, creatinine 0.6.  PHYSICAL EXAMINATION:  VITAL SIGNS:  Blood pressure is 128/77, pulse 77, respiratory rate 14, temperature 97.  GENERAL:  The patient is pleasant, alert and sitting in his chair. EYES:  Pupils equal, round, and reactive to light. EAR, NOSE, AND THROAT:  Notable for intact dentition.  Pink and moist mucosa. NECK:  Supple without JVD or lymphadenopathy. CHEST:  Clear with fair air movements and breathing is nonlabored. HEART:  Regular rate and rhythm without murmur, rubs, or gallops. ABDOMEN:  Soft, nontender.  Bowel sounds are positive. SKIN:  Generally intact except for the surgical site which was clean with  Steri-Strips. NEUROLOGICAL:  Reflexes are 1+.  Sensation is slightly diminished in left lower extremity at 1+/2.  Strength in the upper extremities is near 4+/5 on the right, 4/5 on the left grossly proximal and distal.  Left lower extremity is 2+ to 3/5 proximally to 4/5 distally.  Right lower extremity is 4/5 proximally to 4+/5 distally.  The patient did have some associated edema of both lower extremities at 1+.  Judgment, orientation, memory, and mood are all appropriate.  POST ADMISSION PHYSICIAN EVALUATION: 1. Functional deficit secondary to cervical myelopathy at C6-C7 status     post anterior decompression.  Rehab course complicated by acute     pulmonary embolus requiring transfer. 2. The patient is admitted to receive collaborative interdisciplinary     care between the physiatrist, rehab nursing staff, and therapy     team. 3. The patient's level of medical complexity and substantial therapy     needs in context of that medical necessity cannot be provided at a     lesser intensity of care. 4. The patient has experienced substantial functional loss from his     baseline.  Premorbidly, he had been independent with a walker.  Currently, he is modified independent bed mobility, min assist     transfer, min assist gait 44 feet with rolling walker, and min     assist ADLs.  Judging by the patient's diagnosis, physical exam,     and functional history, the patient has potential for further     functional progress which will result in measurable gains while in     inpatient rehab. 5. The physiatrist will provide 24-hour management of medical needs as     well as oversight of the therapy plan/treatment and provide     guidance as appropriate regarding interaction of the two.  Medical     problem list and plan are below. 6. A 24-hour rehab nursing team will assist in the management of the     patient's self care needs as well as bowel and bladder function,     respiratory  followup, pain and integration of therapy, concepts,     and techniques. 7. PT will assess and treat for lower extremity strength, range of     motion, cardiopulmonary endurance, safety with goals modified     independent. 8. OT will assess and treat for upper extremity strength, ADLs,     adaptive equipment, training, safety, cardiopulmonary endurance,     functional mobility with goals modified independent to perhaps set     up. 9. Case management and social worker will assess and treat for     psychosocial issues and discharge planning. 10.Team conference will be held weekly to assess progress towards     goals and to determine barriers at discharge. 11.The patient has demonstrated sufficient medical stability and     exercise capacity to tolerate at least 3 hours of therapy per day     at least 5 days per week. 12.Estimate length of stay is 1 week.  The patient is motivated and     prognosis is good.  MEDICAL PROBLEM LIST AND PLAN: 1. Pulmonary embolism, treatment with subcu Lovenox and Coumadin.     Cover with subcu Lovenox with therapeutic dosing until INR is     greater than 2.0 for 24 hours.  His INR currently is 1.26 today.     Observe for tolerance of activities with PT as we become more     aggressive with activities. 2. Blood pressure control with Norvasc, Coreg, and Aldactone.  Blood     pressure is normotensive today and again follow up for fluctuations     with activity change. 3. Diabetes type 2:  Check CBGs a.c. and at bedtime.  Diabetic     teaching.  He is on high dose Lantus 45 units a.m., 35 units p.m. 4. Obstructive sleep apnea/chronic obstructive pulmonary disease:     Singulair, Spiriva and Advair. 5. Depression:  Seroquel, Remeron, Lexapro.  The patient seems to be     very upbeat at this time.  We will provide ego support as     appropriate. 6. MRSA:  Contact precautions. 7. Pain management with p.r.n.  Vicodin and Robaxin.  Pain seems to be     fairly  well-controlled at this point.     Ranelle Oyster, M.D.     ZTS/MEDQ  D:  11/10/2010  T:  11/11/2010  Job:  [id number]  Electronically Signed by Faith Rogue M.D. on 11/11/2010 11:15:00 PM

## 2010-11-12 DIAGNOSIS — M4712 Other spondylosis with myelopathy, cervical region: Secondary | ICD-10-CM

## 2010-11-12 DIAGNOSIS — I2699 Other pulmonary embolism without acute cor pulmonale: Secondary | ICD-10-CM

## 2010-11-12 DIAGNOSIS — G8220 Paraplegia, unspecified: Secondary | ICD-10-CM

## 2010-11-12 LAB — GLUCOSE, CAPILLARY
Glucose-Capillary: 63 mg/dL — ABNORMAL LOW (ref 70–99)
Glucose-Capillary: 94 mg/dL (ref 70–99)

## 2010-11-12 LAB — PROTIME-INR: INR: 2.02 — ABNORMAL HIGH (ref 0.00–1.49)

## 2010-11-12 MED ORDER — AMLODIPINE BESYLATE 5 MG PO TABS
5.0000 mg | ORAL_TABLET | Freq: Every day | ORAL | Status: DC
  Administered 2010-11-14 – 2010-11-19 (×6): 5 mg via ORAL
  Filled 2010-11-12 (×8): qty 1

## 2010-11-12 MED ORDER — PROMETHAZINE HCL 12.5 MG PO TABS: 12.5000 mg | ORAL_TABLET | Freq: Four times a day (QID) | ORAL | Status: DC | PRN

## 2010-11-12 MED ORDER — TRAZODONE HCL 50 MG PO TABS: 25.0000 mg | ORAL_TABLET | Freq: Every evening | ORAL | Status: DC | PRN

## 2010-11-12 MED ORDER — ASPIRIN EC 81 MG PO TBEC
81.0000 mg | DELAYED_RELEASE_TABLET | Freq: Every day | ORAL | Status: DC
  Administered 2010-11-14 – 2010-11-19 (×6): 81 mg via ORAL
  Filled 2010-11-12 (×8): qty 1

## 2010-11-12 MED ORDER — ROSUVASTATIN CALCIUM 10 MG PO TABS
10.0000 mg | ORAL_TABLET | Freq: Every day | ORAL | Status: DC
  Administered 2010-11-14 – 2010-11-18 (×5): 10 mg via ORAL
  Filled 2010-11-12 (×7): qty 1

## 2010-11-12 MED ORDER — PANTOPRAZOLE SODIUM 40 MG PO TBEC
40.0000 mg | DELAYED_RELEASE_TABLET | Freq: Two times a day (BID) | ORAL | Status: DC
  Administered 2010-11-14 – 2010-11-19 (×11): 40 mg via ORAL
  Filled 2010-11-12 (×5): qty 1

## 2010-11-12 MED ORDER — MONTELUKAST SODIUM 10 MG PO TABS
10.0000 mg | ORAL_TABLET | Freq: Every day | ORAL | Status: DC
  Administered 2010-11-14 – 2010-11-19 (×7): 10 mg via ORAL
  Filled 2010-11-12 (×8): qty 1

## 2010-11-12 MED ORDER — QUETIAPINE FUMARATE 300 MG PO TABS
300.0000 mg | ORAL_TABLET | Freq: Every day | ORAL | Status: DC
  Administered 2010-11-14 – 2010-11-18 (×6): 300 mg via ORAL
  Filled 2010-11-12 (×8): qty 1

## 2010-11-12 MED ORDER — SPIRONOLACTONE 25 MG PO TABS
25.0000 mg | ORAL_TABLET | Freq: Every day | ORAL | Status: DC
  Administered 2010-11-14 – 2010-11-19 (×6): 25 mg via ORAL
  Filled 2010-11-12 (×8): qty 1

## 2010-11-12 MED ORDER — PROMETHAZINE HCL 12.5 MG RE SUPP: 12.5000 mg | Freq: Four times a day (QID) | RECTAL | Status: DC | PRN

## 2010-11-12 MED ORDER — DOXYCYCLINE HYCLATE 100 MG PO TABS
100.0000 mg | ORAL_TABLET | Freq: Two times a day (BID) | ORAL | Status: DC
  Administered 2010-11-14 – 2010-11-19 (×11): 100 mg via ORAL
  Filled 2010-11-12 (×15): qty 1

## 2010-11-12 MED ORDER — ESCITALOPRAM OXALATE 20 MG PO TABS
20.0000 mg | ORAL_TABLET | Freq: Every day | ORAL | Status: DC
  Administered 2010-11-14 – 2010-11-19 (×6): 20 mg via ORAL
  Filled 2010-11-12 (×8): qty 1

## 2010-11-12 MED ORDER — MIRTAZAPINE 15 MG PO TABS
15.0000 mg | ORAL_TABLET | Freq: Every day | ORAL | Status: DC
  Administered 2010-11-14 – 2010-11-18 (×6): 15 mg via ORAL
  Filled 2010-11-12 (×8): qty 1

## 2010-11-12 MED ORDER — INSULIN ASPART 100 UNIT/ML ~~LOC~~ SOLN
0.0000 [IU] | Freq: Three times a day (TID) | SUBCUTANEOUS | Status: DC
  Administered 2010-11-14 – 2010-11-15 (×2): 1 [IU] via SUBCUTANEOUS
  Administered 2010-11-16 – 2010-11-17 (×3): 2 [IU] via SUBCUTANEOUS
  Administered 2010-11-17: 1 [IU] via SUBCUTANEOUS
  Administered 2010-11-18: 3 [IU] via SUBCUTANEOUS
  Administered 2010-11-18: 2 [IU] via SUBCUTANEOUS
  Administered 2010-11-19: 0 [IU] via SUBCUTANEOUS
  Filled 2010-11-12 (×2): qty 3

## 2010-11-12 MED ORDER — SORBITOL 70 % SOLN: 30.0000 mL | Freq: Two times a day (BID) | Status: DC | PRN

## 2010-11-12 MED ORDER — BISACODYL 10 MG RE SUPP: 10.0000 mg | Freq: Every evening | RECTAL | Status: DC | PRN

## 2010-11-12 MED ORDER — TIOTROPIUM BROMIDE MONOHYDRATE 18 MCG IN CAPS
18.0000 ug | ORAL_CAPSULE | Freq: Every day | RESPIRATORY_TRACT | Status: DC
  Administered 2010-11-14 – 2010-11-19 (×6): 18 ug via RESPIRATORY_TRACT
  Filled 2010-11-12: qty 5

## 2010-11-12 MED ORDER — PROMETHAZINE HCL 25 MG/ML IJ SOLN: 12.5000 mg | Freq: Four times a day (QID) | INTRAMUSCULAR | Status: DC | PRN

## 2010-11-12 MED ORDER — CARVEDILOL 12.5 MG PO TABS
12.5000 mg | ORAL_TABLET | Freq: Two times a day (BID) | ORAL | Status: DC
  Administered 2010-11-14 – 2010-11-19 (×11): 12.5 mg via ORAL
  Filled 2010-11-12 (×15): qty 1

## 2010-11-12 MED ORDER — METHOCARBAMOL 500 MG PO TABS
500.0000 mg | ORAL_TABLET | Freq: Four times a day (QID) | ORAL | Status: DC | PRN
  Administered 2010-11-15 – 2010-11-18 (×2): 500 mg via ORAL
  Filled 2010-11-12: qty 1

## 2010-11-12 MED ORDER — INSULIN GLARGINE 100 UNIT/ML ~~LOC~~ SOLN
40.0000 [IU] | Freq: Every day | SUBCUTANEOUS | Status: DC
  Administered 2010-11-14 – 2010-11-17 (×4): 40 [IU] via SUBCUTANEOUS
  Filled 2010-11-12: qty 3

## 2010-11-12 MED ORDER — INSULIN GLARGINE 100 UNIT/ML ~~LOC~~ SOLN
15.0000 [IU] | Freq: Every day | SUBCUTANEOUS | Status: DC
  Administered 2010-11-14 – 2010-11-17 (×4): 15 [IU] via SUBCUTANEOUS
  Filled 2010-11-12: qty 3

## 2010-11-12 MED ORDER — HYDROCODONE-ACETAMINOPHEN 5-325 MG PO TABS
1.0000 | ORAL_TABLET | ORAL | Status: DC | PRN
  Administered 2010-11-14 – 2010-11-16 (×8): 2 via ORAL

## 2010-11-12 MED ORDER — FLUTICASONE-SALMETEROL 250-50 MCG/DOSE IN AEPB
1.0000 | INHALATION_SPRAY | Freq: Two times a day (BID) | RESPIRATORY_TRACT | Status: DC
  Administered 2010-11-14 – 2010-11-19 (×11): 1 via RESPIRATORY_TRACT
  Filled 2010-11-12: qty 14

## 2010-11-12 MED ORDER — ACETAMINOPHEN 325 MG PO TABS: 325.0000 mg | ORAL_TABLET | ORAL | Status: DC | PRN

## 2010-11-12 MED ORDER — SENNA 8.6 MG PO TABS
2.0000 | ORAL_TABLET | Freq: Every day | ORAL | Status: DC
  Administered 2010-11-14 – 2010-11-18 (×5): 17.2 mg via ORAL
  Filled 2010-11-12 (×6): qty 2

## 2010-11-12 NOTE — Discharge Summary (Signed)
NAME:  Cody Cordova, Cody Cordova NO.:  [id number]  MEDICAL RECORD NO.:  [id number]  LOCATION:  2031                         FACILITY:  [facility]  PHYSICIAN:  Brendia Sacks, MD    DATE OF BIRTH:  07-18-1970  DATE OF ADMISSION:  11/07/2010 DATE OF DISCHARGE:  11/10/2010                        DISCHARGE SUMMARY - REFERRING   PRIMARY CARE PHYSICIAN:  Is in [place], [place].  PRIMARY SURGEON:  Dr. Alveda Reasons of Neurosurgery.  CONDITION ON DISCHARGE:  Improved.  DISPOSITION:  Return to inpatient rehabilitation for continued therapy.  DISCHARGE DIAGNOSES: 1. Acute pulmonary embolism. 2. Diabetes mellitus, stable. 3. Status post recent cervical neck surgery. 4. Hypertension, stable. 5. Low-attenuation, left thyroid nodule.  Consider ultrasound in the     outpatient setting.  HISTORY OF PRESENT ILLNESS:  This is a 39 year old man who was at inpatient rehab following cervical surgery who developed chest pain and was admitted for further evaluation and treatment.  HOSPITAL COURSE:  Mr. Willemsen was admitted in after further imaging was found to have a pulmonary embolism.  He had already been started empirically on heparin and he continues on Lovenox and Coumadin at this time.  He is chest pain-free, and denies shortness of breath.  Telemetry is unremarkable.  At this point, he appears to be medically stable to me to return to inpatient rehab.  I did discuss the case with Dr. Alveda Reasons, his surgeon who agreed with Lovenox and Coumadin.  At this time.  I have also discussed the case with inpatient rehab.  CONSULTATIONS: 1. Inpatient rehab for return to their facility for further rehab.  PROCEDURES:  None.  IMAGING: 1. Chest x-ray on November 07, 2010:  Vascular congestion, mild     bibasilar atelectasis. 2. Nuclear medicine ventilation perfusion scan on November 07, 2010:     Indeterminate for pulmonary emboli. 3. CT angiogram of the chest, November 08, 2010:  Pulmonary emboli     involving branches of the right upper lobe and right lower lobe,     which are nonobstructing.  Incidental small low-attenuation left     thyroid nodule. 4. Microbiology none.  LABORATORY STUDIES: 1. CBC notable for hemoglobin of 9.9. 2. Discharge INR is 1.26. 3. Basic metabolic panel notable for potassium of 3.3. 4. Cardiac enzymes negative. 5. Discharge instructions, the patient will return to inpatient rehab     today.  Diabetic heart healthy diet.  Activity per Dr. Alveda Reasons and     physical therapy as well as occupational therapy. 6. Follow up with primary care physician in 1-2 weeks after discharge     from inpatient rehab. 7. Discharge medications.  Somewhat difficult to reconcile.  I     discussed the case with inpatient rehab who has provided some     clarification.  Note that the patient is not currently on Opana.  DISCHARGE MEDICATIONS: 1. Colace 100 mg p.o. b.i.d. 2. Doxycycline 100 mg p.o. b.i.d. 3. Lovenox 130 mg subcu b.i.d. 4. Lantus 25 units subcu q.a.m. and 25 units subcu at bedtime. 5. Robaxin 500 mg every 6 hours as needed for muscle spasm. 6. Senna 2 tablets p.o. daily as needed for constipation. 7. Coumadin as per pharmacy daily.  8. Advair 250/50 one puff b.i.d. 9. Amlodipine 5 mg p.o. daily. 10.Carvedilol 12.5 mg p.o. b.i.d. 11.Clarinex 5 mg p.o. daily. 12.Fluticasone topical 1 application b.i.d. to affected area. 13.Ketoconazole shampoo 1 application topically every 3 days. 14.Lexapro 20 mg p.o. daily. 15.Lipitor 20 mg p.o. daily. 16.Mirtazapine 15 mg p.o. at bedtime. 17.Nexium 40 mg p.o. daily. 18.Norco oral 10/325 one to two tablets by mouth every 6 hours as     needed for pain. 19.Potassium chloride 20 mEq p.o. daily. 20.Protopic one application b.i.d. as directed. 21.Seroquel 200 mg p.o. daily. 22.Singular 10 mg p.o. daily. 23.Spiriva one inhalation daily. 24.Spironolactone 25 mg p.o. daily. 25.Taclonex topical to affected area  daily.  Discontinue; 1. Opana.  Note that the patient has not been on this medication. 2. Note that the dosage changed to Robaxin and Lantus. 3. Discontinue Lasix.  Time reviewing the patient's chart and coordinating discharge is 36 minutes.     Brendia Sacks, MD     DG/MEDQ  D:  11/10/2010  T:  11/10/2010  Job:  [id number]  cc:   Nelda Severe, MD  Electronically Signed by Brendia Sacks  on 11/12/2010 06:34:24 PM

## 2010-11-13 LAB — GLUCOSE, CAPILLARY
Glucose-Capillary: 86 mg/dL (ref 70–99)
Glucose-Capillary: 109 mg/dL — ABNORMAL HIGH (ref 70–99)

## 2010-11-13 LAB — PROTIME-INR: INR: 2.38 — ABNORMAL HIGH (ref 0.00–1.49)

## 2010-11-13 MED ORDER — WARFARIN SODIUM 10 MG PO TABS
10.0000 mg | ORAL_TABLET | Freq: Every day | ORAL | Status: DC
  Administered 2010-11-14: 10 mg via ORAL
  Filled 2010-11-13 (×3): qty 1

## 2010-11-14 DIAGNOSIS — J449 Chronic obstructive pulmonary disease, unspecified: Secondary | ICD-10-CM

## 2010-11-14 DIAGNOSIS — I2699 Other pulmonary embolism without acute cor pulmonale: Secondary | ICD-10-CM

## 2010-11-14 DIAGNOSIS — A4902 Methicillin resistant Staphylococcus aureus infection, unspecified site: Secondary | ICD-10-CM

## 2010-11-14 DIAGNOSIS — I10 Essential (primary) hypertension: Secondary | ICD-10-CM

## 2010-11-14 DIAGNOSIS — G959 Disease of spinal cord, unspecified: Secondary | ICD-10-CM

## 2010-11-14 DIAGNOSIS — D62 Acute posthemorrhagic anemia: Secondary | ICD-10-CM

## 2010-11-14 DIAGNOSIS — E119 Type 2 diabetes mellitus without complications: Secondary | ICD-10-CM

## 2010-11-14 LAB — CBC
MCH: 27.4 pg (ref 26.0–34.0)
MCV: 81.3 fL (ref 78.0–100.0)
Platelets: 276 10*3/uL (ref 150–400)
RBC: 4.38 MIL/uL (ref 4.22–5.81)

## 2010-11-14 LAB — GLUCOSE, CAPILLARY
Glucose-Capillary: 133 mg/dL — ABNORMAL HIGH (ref 70–99)
Glucose-Capillary: 181 mg/dL — ABNORMAL HIGH (ref 70–99)

## 2010-11-14 LAB — PROTIME-INR: INR: 2.63 — ABNORMAL HIGH (ref 0.00–1.49)

## 2010-11-14 NOTE — Progress Notes (Signed)
Patient is alert, oriented x3. Out of bed to wheelchair. Ambulates with minimal assist. c-collar in place. Skin is dry with healed scars over his entire body. Dressing changed to right lower abdomen and right thigh wound. Dressing dry and intact. Complaint of pain to right hip. vicodin given. Continent of bowel and bladder. Last bowel movement 11/2. Patient participating in therapies.

## 2010-11-14 NOTE — Progress Notes (Addendum)
Occupational Therapy Session Note  Patient Details  Name: MAURICE FOTHERINGHAM MRN: [id number] Date of Birth: Jul 23, 1970  Today's Date: 11/14/2010 Time:60  -     Precautions: Precautions Precautions: Other (comment) Actuary on at all times) Required Braces or Orthoses: Yes Cervical Brace: Hard collar Restrictions Weight Bearing Restrictions: Yes  Short Term Goals:    Skilled Therapeutic Interventions/Progress Updates:  ADL-retraining at shower level with emphasis on safety awareness, use of DME (tub bench), self-monitoring, and edema control.   Pt. Reported he was not confident he could maintain neutral posture during bathing w/o Aspen collar.   Pt. Was able to direct hand shower to avoid over-spray to collar effectively.   Pt. Continues to require assist with LE dressing to apply TED hose.      General   Vital Signs   Pain Pain Assessment Pain Score: 0-No pain Faces Pain Scale: No hurt PAINAD (Pain Assessment in Advanced Dementia) Breathing: normal Critical Care Pain Observation Tool (CPOT) Facial Expression: Relaxed, neutral ADL ADL Equipment Provided: Long-handled sponge Eating: Set up Where Assessed-Eating: Wheelchair Grooming: Setup Where Assessed-Grooming: Sitting at sink;Wheelchair Upper Body Bathing: Setup Where Assessed-Upper Body Bathing: Shower Lower Body Bathing: Modified independent Where Assessed-Lower Body Bathing: Shower Upper Body Dressing: Setup Where Assessed-Upper Body Dressing: Wheelchair Lower Body Dressing: Moderate assistance Where Assessed-Lower Body Dressing: Wheelchair Toileting: Modified independent Where Assessed-Toileting: Teacher, adult education: Modified Community education officer Method: Surveyor, minerals: Acupuncturist: Cytogeneticist Method: Warden/ranger: Transfer tub bench;Grab bars ADL Comments:  (Not confident he can hold head  still w/o collar this date.)  Exercises    Other Treatments    Therapy/Group: Individual Therapy  Georgeanne Nim 11/14/2010, 12:23 PM

## 2010-11-14 NOTE — Progress Notes (Signed)
ANTICOAGULATION CONSULT NOTE - Follow Up Consult  Pharmacy Consult for Warfarin Indication: pulmonary embolus  Allergies  Allergen Reactions  . Iodine Other (See Comments)    hypertension  . Iohexol      Desc: USES 13 HR PREP   . Sulfa Antibiotics     Patient Measurements: Height: 5\' 11"  (180.3 cm) (Entered for Cutover) Weight: 295 lb 6.7 oz (134 kg) (Entered for Cutover) IBW/kg (Calculated) : 75.3  Adjusted Body Weight: 92.9 kg  Vital Signs: Temp: 98.3 F (36.8 C) (11/04 0525) Temp src: Oral (11/04 0525) BP: 129/81 mmHg (11/04 0525) Pulse Rate: 76  (11/04 0525)  Labs:  Basename 11/14/10 0720 11/13/10 0730 11/12/10 0643  HGB 12.0* -- --  HCT 35.6* -- --  PLT 276 -- --  APTT -- -- --  LABPROT 28.5* 26.4* 23.2*  INR 2.63* 2.38* 2.02*  HEPARINUNFRC -- -- --  CREATININE -- -- --  CKTOTAL -- -- --  CKMB -- -- --  TROPONINI -- -- --   Estimated Creatinine Clearance: 171.5 ml/min (by C-G formula based on Cr of 0.62).   Medications:  Scheduled:    . amLODipine  5 mg Oral Daily  . aspirin EC  81 mg Oral Daily  . calcipotriene-betamethasone   Topical Daily  . carvedilol  12.5 mg Oral BID WC  . doxycycline  100 mg Oral BID  . escitalopram  20 mg Oral Daily  . Fluticasone-Salmeterol  1 puff Inhalation BID  . insulin aspart  0-9 Units Subcutaneous TID WC  . insulin glargine  15 Units Subcutaneous QHS  . insulin glargine  40 Units Subcutaneous Daily  . mirtazapine  15 mg Oral QHS  . montelukast  10 mg Oral Daily  . pantoprazole  40 mg Oral BID  . QUEtiapine  300 mg Oral QHS  . rosuvastatin  10 mg Oral q1800  . senna  2 tablet Oral QHS  . spironolactone  25 mg Oral Daily  . tiotropium  18 mcg Inhalation Daily  . warfarin  10 mg Oral q1800    Assessment: Pt. on warfarin for Hx. of PE and is without noted bleeding complications with therapy.  He is still trending upward slightly with INR (2.63) today  but remains within desired goal range. Goal of Therapy:  INR  2-3   Plan:  Continue Warfarin 10 mg today and f/u am labs.  If PT/INR still rising, will decrease dose some to avoid overshooting desired goal.  Chinita Greenland 11/14/2010,12:53 PM

## 2010-11-14 NOTE — Progress Notes (Signed)
  Subjective: Doing well. C/O left knee weakness. Explained that this is probably related to myelopathy.  Objective: Vital signs in last 24 hours: Temp:  [98.3 F (36.8 C)] 98.3 F (36.8 C) (11/04 0525) Pulse Rate:  [76] 76  (11/04 0525) Resp:  [20] 20  (11/04 0525) BP: (129)/(81) 129/81 mmHg (11/04 0525) SpO2:  [98 %-99 %] 98 % (11/04 0740) Weight change:     Intake/Output from previous day: 11/03 0701 - 11/04 0700 In: -  Out: 700 [Urine:700] Last cbgs: CBG (last 3)   Basename 11/14/10 0723 11/13/10 2051 11/13/10 1646  GLUCAP 78 148* 149*     Physical Exam  General: No apparent distress    Lungs: Normal respiratory effort. Lungs are clear to auscultation, no crackles or wheezes. Cardiovascular: Regular rate and rhythm, no edema Musculoskeletal:  Neurovascularly intact Neurological: No change Wound: clean and dry.  Lab Results:  [facility] 11/14/10 0720  WBC 5.8  HGB 12.0*  HCT 35.6*  PLT 276   BMET No results found for this basename: NA:2,K:2,CL:2,CO2:2,GLUCOSE:2,BUN:2,CREATININE:2,CALCIUM:2 in the last 72 hours  Studies/Results: No results found.  Medications: I have reviewed the patient's current medications.  Assessment/Plan: Patient Active Problem List  Diagnoses  . Cervical myelopathy stable, continue current therapies in CIR  . Diabetes mellitus CBG 78-149 - continue same  . Pulmonary embolism continue coumadin, ASA, Lovenox  . HTN (hypertension) cont current care  . COPD (chronic obstructive pulmonary disease) stable   Anemia associated with acute blood loss stable H/H 12.0/35.6  . MRSA (methicillin resistant Staphylococcus aureus) cont precautions    LOS: 4 days   Suzan Slick ,PA-C 11/14/2010, 8:41 AM  Rene Paci, MD 11/14/2010 9:53 AM

## 2010-11-15 LAB — PROTIME-INR
INR: 2.88 — ABNORMAL HIGH (ref 0.00–1.49)
Prothrombin Time: 30.6 seconds — ABNORMAL HIGH (ref 11.6–15.2)

## 2010-11-15 LAB — CBC
Hemoglobin: 8.8 g/dL — ABNORMAL LOW (ref 13.0–17.0)
MCHC: 30.7 g/dL (ref 30.0–36.0)
RDW: 17.1 % — ABNORMAL HIGH (ref 11.5–15.5)

## 2010-11-15 LAB — GLUCOSE, CAPILLARY: Glucose-Capillary: 99 mg/dL (ref 70–99)

## 2010-11-15 MED ORDER — WARFARIN SODIUM 10 MG PO TABS
10.0000 mg | ORAL_TABLET | Freq: Every day | ORAL | Status: DC
  Administered 2010-11-16: 10 mg via ORAL
  Filled 2010-11-15 (×2): qty 1

## 2010-11-15 MED ORDER — WARFARIN SODIUM 7.5 MG PO TABS
7.5000 mg | ORAL_TABLET | Freq: Once | ORAL | Status: AC
  Administered 2010-11-15: 7.5 mg via ORAL
  Filled 2010-11-15: qty 1

## 2010-11-15 MED ORDER — LIDOCAINE 5 % EX PTCH
1.0000 | MEDICATED_PATCH | CUTANEOUS | Status: DC
  Administered 2010-11-15 – 2010-11-19 (×5): 1 via TRANSDERMAL
  Filled 2010-11-15 (×6): qty 1

## 2010-11-15 NOTE — Progress Notes (Signed)
Pain  managed with prn Vicodin x 2 tabs  X 1 dose this shift. New order received for Lidocaine patch to R hip. Pt calls appropriately for toileting assist, and pain medication. Pills whole in water. Stand/pivot transfer with supervision. Uses urinal independently, requiring staff to empty. Nutrition adequate, pt receiving double portion. Self feeds independently

## 2010-11-15 NOTE — Plan of Care (Signed)
Problem: RH Wheelchair Mobility Goal: LTG Patient will propel w/c in home environment (PT) Pt will propel wheelchair 25 ft in home environment Modified Independent

## 2010-11-15 NOTE — Progress Notes (Signed)
ANTICOAGULATION CONSULT NOTE - Follow Up Consult  Pharmacy Consult for coumadin  Indication: pulmonary embolus  Allergies  Allergen Reactions  . Iodine Other (See Comments)    hypertension  . Iohexol      Desc: USES 13 HR PREP   . Sulfa Antibiotics     Patient Measurements: Height: 5\' 11"  (180.3 cm) (Entered for Cutover) Weight: 295 lb 6.7 oz (134 kg) (Entered for Cutover) IBW/kg (Calculated) : 75.3  Adjusted Body Weight:   Vital Signs: Temp: 98.7 F (37.1 C) (11/05 0500) Temp src: Oral (11/05 0500) BP: 165/75 mmHg (11/05 0500) Pulse Rate: 74  (11/05 1130)  Labs:  Basename 11/15/10 0600 11/14/10 0720 11/13/10 0730  HGB 8.8* 12.0* --  HCT 28.7* 35.6* --  PLT 159 276 --  APTT -- -- --  LABPROT 30.6* 28.5* 26.4*  INR 2.88* 2.63* 2.38*  HEPARINUNFRC -- -- --  CREATININE -- -- --  CKTOTAL -- -- --  CKMB -- -- --  TROPONINI -- -- --   Estimated Creatinine Clearance: 171.5 ml/min (by C-G formula based on Cr of 0.62).   Medications:    Assessment: INR therapeutic but trending up on 10mg  daily. No bleeding reported Goal of Therapy:  INR 2-3   Plan: coumadin 7.5mg  today, then 10mg  daily and start MWF INRs.  Len Childs T 11/15/2010,

## 2010-11-15 NOTE — Progress Notes (Signed)
Subjective/Complaints: No new complaints except for right hip pain.  Objective: Vital Signs: Blood pressure 165/75, pulse 86, temperature 98.7 F (37.1 C), temperature source Oral, resp. rate 20, height 5\' 11"  (1.803 m), weight 134 kg (295 lb 6.7 oz), SpO2 96.00%. No results found.  Basename 11/14/10 0720  WBC 5.8  HGB 12.0*  HCT 35.6*  PLT 276   No results found for this basename: NA:2,K:2,CL:2,CO2:2,GLUCOSE:2,BUN:2,CREATININE:2,CALCIUM:2 in the last 72 hours  Physical Exam: General appearance: alert Head: Normocephalic, without obvious abnormality, atraumatic Eyes: conjunctivae/corneas clear. PERRL, EOM's intact. Fundi benign. Ears: normal TM's and external ear canals both ears Nose: Nares normal. Septum midline. Mucosa normal. No drainage or sinus tenderness. Throat: lips, mucosa, and tongue normal; teeth and gums normal Neck: no adenopathy, no carotid bruit, no JVD, supple, symmetrical, trachea midline and thyroid not enlarged, symmetric, no tenderness/mass/nodules Back: symmetric, no curvature. ROM normal. No CVA tenderness. Resp: clear to auscultation bilaterally Cardio: regular rate and rhythm, S1, S2 normal, no murmur, click, rub or gallop GI: soft, non-tender; bowel sounds normal; no masses,  no organomegaly Extremities: extremities normal, atraumatic, no cyanosis or edema Pulses:  L brachial 2+ R brachial 2+  L radial 2+ R radial 2+  L inguinal 2+ R inguinal 2+  L popliteal 2+ R popliteal 2+  L posterior tibial 2+ R posterior tibial 2+  L dorsalis pedis 2+ R dorsalis pedis 2+   Skin: Skin color, texture, turgor normal. No rashes or lesions or normal Neurologic: Motor: fair   Incision/Wound: well healed surgical site.  Patient with pain at right groin/ hip.  Tender with touch and ROM.   Assessment/Plan: 1. Functional deficits secondary to cervical myelopathy which require 3+ hours per day of interdisciplinary therapy in a comprehensive inpatient rehab  setting. Physiatrist is providing close team supervision and 24 hour management of active medical problems listed below. Physiatrist and rehab team continue to assess barriers to discharge/monitor patient progress toward functional and medical goals. Mobility:   Transfers Transfers: Yes Sit to Stand: 6: Modified independent (Device/Increase time)     ADL:   Cognition: Cognition Overall Cognitive Status: Appears within functional limits for tasks assessed Orientation Level: Oriented X4 Attention: Selective Selective Attention: Appears intact Memory: Appears intact Awareness: Appears intact Safety/Judgment: Appears intact Cognition Orientation Level: Oriented X4  2. Anticoagulation/DVT prophylaxis with Pharmaceutical: Coumdin   3. Pain Management:norco and robaxin.  Will add lidoderm patch for right hip/inguinal pain  Patient Active Hospital Problem List: Cervical myelopathy (11/14/2010)    POA: Unknown   Assessment:The current medical regimen is effective;  continue present plan and medications.   Plan: Diabetes mellitus (11/14/2010)    POA: Unknown   Assessment:The current medical regimen is effective;  continue present plan and medications.   Plan Pulmonary embolism (11/14/2010)    POA: Unknown   Assessment: The current medical regimen is effective;  continue present plan and medications.   Plan:  HTN (hypertension) (11/14/2010)    POA: Unknown   Assessment: The current medical regimen is effective;  continue present plan and medications.   Plan:  COPD (chronic obstructive pulmonary disease) (11/14/2010)    POA: Unknown   Assessment: The current medical regimen is effective;  continue present plan and medications.   Plan:  Anemia associated with acute blood loss (11/14/2010)    POA: Unknown   Assessment: The current medical regimen is effective;  continue present plan and medications.   Plan:  MRSA (methicillin resistant Staphylococcus aureus) (11/14/2010)    POA: Unknown  Assessment: The current medical regimen is effective;  continue present plan and medications.   Plan:    5  ANGIULLI,DANIEL J. 11/15/2010, 6:57 AM

## 2010-11-15 NOTE — Progress Notes (Signed)
Physical Therapy Note  Patient Details  Name: Cody Cordova MRN: [id number] Date of Birth: [birth year] Today's Date: 11/15/2010  Partial sit to stands at table focusing on not hyperextending knees time 10, and squats with abduction times 10   Julian Reil 11/15/2010, 3:25 PM

## 2010-11-15 NOTE — Progress Notes (Signed)
Physical Therapy Session Note  Patient Details  Name: Cody Cordova MRN: [id number] Date of Birth: 1970-08-03  Today's Date: 11/15/2010 Time: 4098-1191 Time Calculation (min): 45 min  Precautions: Precautions Precautions: Other (comment) Actuary on at all times) Required Braces or Orthoses: Yes Cervical Brace: Hard collar Restrictions Weight Bearing Restrictions: No  Short Term Goals:    Skilled Therapeutic Interventions/Progress Updates: Discussed the need for a wc with functional activities secondary to poor balance reactions and poor endurance. Pt. Was unable to use a RW safely and cook a whole meal.     General Chart Reviewed: Yes Family/Caregiver Present: No Vital Signs   Pain Pain Assessment Pain Assessment: 0-10 Pain Score:   6 Faces Pain Scale: Hurts little more Pain Type: Surgical pain Pain Location: Hip Pain Orientation: Right Pain Descriptors: Sore Pain Onset: With Activity Pain Intervention(s): Repositioned Multiple Pain Sites: No PAINAD (Pain Assessment in Advanced Dementia) Breathing: normal Negative Vocalization: none Facial Expression: smiling or inexpressive Body Language: relaxed Consolability: no need to console PAINAD Score: 0  Critical Care Pain Observation Tool (CPOT) Facial Expression: Relaxed, neutral Muscle Tension: Relaxed Compliance with ventilator (intubated pts.): N/A Vocalization (extubated pts.): N/A Mobility Bed Mobility Bed Mobility: No Transfers Transfers: Yes Sit to Stand: 4: Min Designer, television/film set Transfers: 5: Supervision Squat Pivot Transfer Details: Verbal cues for technique Squat Pivot Transfer Details (indicate cue type and reason): cues not to let go off wc Locomotion  Ambulation Ambulation: Yes Ambulation/Gait Assistance: 4: Min assist Ambulation Distance (Feet): 100 Feet Assistive device: Rolling walker Ambulation/Gait Assistance Details: Tactile cues for posture;Tactile cues for placement;Visual  cues/gestures for precautions/safety;Visual cues for safe use of DME/AE;Verbal cues for technique;Verbal cues for precautions/safety Gait Gait: Yes Gait Pattern: Impaired Gait Pattern: Step-through pattern;Right foot flat;Left foot flat Ankle - Stance Phase - Impaired Gait Pattern: Decreased push off/heel off - Left;Decreased push off/heel off - Right Wheelchair Mobility Wheelchair Mobility: Yes Wheelchair Assistance: 6: Modified independent (Device/Increase time) Wheelchair Propulsion: Both upper extremities (lower extremities in the [place]) Distance: 200   Exercises    Other Treatments Treatments Therapeutic Activity: problem solving, balance, and energy conservation in kitchen enviroment.  decided on the need for a wc for kitchen activities.  Therapy/Group: Individual Therapy  Julian Reil 11/15/2010, 11:27 AM

## 2010-11-15 NOTE — Progress Notes (Signed)
Physical Therapy Weekly Progress Note  Patient Details  Name: Cody Cordova MRN: [id number] Date of Birth: September 25, 1970 Today's Date: 11/15/2010 STG are LTGs. Pt required min assist on evaluation for gait. And for transfers. Pt presents with significant billateral knee hyperextension and heavy dependence on upper extremities with  RW for gait. Pt must be modified independent including with household activities to discharge home.  Pt continues to need min assist to regain balance with vc needed to reduce knee hyperextension.home enviroment wc goal added to allow modified independence for kitchen activities.  Pt initially requried supervision to propell wc 75 feet and currently is modified independent over 150 feet controlled enviroment.  Patient continues to demonstrate the following deficits: gait impairments, decreased balance, poor endurance, weakness in hip and knees, lower extremity edema, and fall risk. and therefore will continue to benefit from skilled PT intervention to enhance overall performance with activity tolerance, balance, postural control, ability to compensate for deficits, functional use of  right lower extremity and left lower extremity and coordination.  Patient progressing toward long term goals..  Continue plan of care.     Julian Reil 11/15/2010, 3:46 PM

## 2010-11-15 NOTE — Progress Notes (Signed)
Occupational Therapy Session Note  Patient Details  Name: Cody Cordova MRN: [id number] Date of Birth: March 28, 1970  Today's Date: 11/15/2010 Time:  - 0730 - -815    Precautions: Precautions Precautions: Other (comment) Actuary on at all times) Required Braces or Orthoses: Yes Cervical Brace: Hard collar Restrictions Weight Bearing Restrictions: No  Short Term Goals:    Skilled Therapeutic Interventions/Progress Updates:    ADL retraining - bathing/dressing w/c level at sink.  Focus on activity tolerance and standing balance.  Pt cleaned up supplies and soiled clothing at w/c level. Min verbal cues for w/c safety.  General   Vital Signs   Pain Pain Assessment Pain Assessment: No/denies pain Pain Score:   8 nursing ADL ADL Equipment Provided: Reacher;Long-handled sponge Eating: Set up Where Assessed-Eating: Wheelchair Grooming: Setup Where Assessed-Grooming: Sitting at sink Upper Body Bathing: Setup Where Assessed-Upper Body Bathing: Sitting at sink Lower Body Bathing: Supervision/safety Where Assessed-Lower Body Bathing: Sitting at sink;Standing at sink Upper Body Dressing: Setup Where Assessed-Upper Body Dressing: Sitting at sink Lower Body Dressing: Minimal assistance Where Assessed-Lower Body Dressing: Standing at sink;Sitting at sink Toileting: Modified independent Where Assessed-Toileting: Teacher, adult education: Modified Community education officer Method: Surveyor, minerals: Grab bars   Exercises    Other Treatments    Therapy/Group: Individual Therapy  Savino, Whisenant 11/15/2010, 9:28 AM

## 2010-11-15 NOTE — Progress Notes (Signed)
Occupational Therapy Note  Patient Details  Name: Cody Cordova MRN: [id number] Date of Birth: 05-15-1970 Today's Date: 11/15/2010  Functional ambulation with R/W for simulated tub transfers with tub bench and toilet transfers to elevated toilet seat. Min verbal cues for safety awareness.  W/C mobility for home management tasks; min verbal cues for locking brakes and problem solving to complete tasks. Rest breaks X 5 during treatment.  Focus on activity tolerance and safety awareness.   Ainsley, Sanguinetti [facility] 11/15/2010, 3:35 PM

## 2010-11-15 NOTE — Progress Notes (Signed)
Physical Therapy Session Note  Patient Details  Name: Cody Cordova MRN: [id number] Date of Birth: 1970/07/16  Today's Date: 11/15/2010 Time: 0900-1000 Time Calculation (min): 60 min  Precautions: Precautions Precautions: Other (comment) Actuary on at all times) Required Braces or Orthoses: Yes Cervical Brace: Hard collar Restrictions Weight Bearing Restrictions: No  Short Term Goals:    Skilled Therapeutic Interventions/Progress Updates:      General Chart Reviewed: Yes Family/Caregiver Present: No Vital Signs Therapy Vitals Pulse Rate: 74  Resp: 20  Patient Position, if appropriate: Sitting Oxygen Therapy O2 Device: None (Room air) Pain Pain Assessment Pain Assessment: No/denies pain Pain Score:   6 Faces Pain Scale: Hurts little more Pain Type: Surgical pain Pain Location: Hip Pain Orientation: Right Pain Descriptors: Sore Pain Onset: With Activity Pain Intervention(s): Repositioned Multiple Pain Sites: No PAINAD (Pain Assessment in Advanced Dementia) Breathing: normal Negative Vocalization: none Facial Expression: smiling or inexpressive Body Language: relaxed Consolability: no need to console PAINAD Score: 0  Critical Care Pain Observation Tool (CPOT) Facial Expression: Relaxed, neutral Muscle Tension: Relaxed Compliance with ventilator (intubated pts.): N/A Vocalization (extubated pts.): N/A Mobility Bed Mobility Bed Mobility: Yes Sit to Supine - Left: 3: Mod assist Sit to Supine - Left Details (indicate cue type and reason): required HOB elevated, but pt. has a hospital at home Transfers Transfers: Yes Sit to Stand: 4: Min assist Squat Pivot Transfers: 4: Min Designer, industrial/product Details: Verbal cues for technique;Verbal cues for precautions/safety Squat Pivot Transfer Details (indicate cue type and reason): Pt. with difficulty maintatining squat, and leaned too far forward Locomotion  Ambulation Ambulation:  Yes Ambulation/Gait Assistance: 4: Min assist Ambulation Distance (Feet): 15 Feet Assistive device: Rolling walker Ambulation/Gait Assistance Details: Tactile cues for posture;Verbal cues for gait pattern Gait Gait: Yes Gait Pattern: Impaired Gait Pattern: Step-through pattern;Right foot flat;Left foot flat Ankle - Stance Phase - Impaired Gait Pattern: Decreased push off/heel off - Left;Decreased push off/heel off - Right High Level Ambulation High Level Ambulation: Side stepping;Backwards walking Side Stepping: RW Backwards Walking: RW Corporate treasurer: Yes Wheelchair Assistance: 5: Financial planner Details: Verbal cues for Diplomatic Services operational officer: Both lower extermities Wheelchair Parts Management: Independent Distance: 10   Exercises    Other Treatments  Therapy/Group: Individual Therapy  Julian Reil 11/15/2010, 12:14 PM

## 2010-11-15 NOTE — Progress Notes (Signed)
Patient Details  Name: Cody Cordova MRN: [id number] Date of Birth: [birth year]  Today's Date: 11/15/2010 Time: 9:10-10:00 50 minutes    Short Term Goals  STG =LTG  Therapy/Group: Other: Co-treat with PT   Activity Level: Moderate: Kitchen Activity ambulatory level using RW &Football Task without upper extremity support Level of assist: Min Assist and Moderate questioning cues  Skilled Therapeutic Interventions/Progress Updates: Discussed energy conservation techniques including use of the w/c versus RW, setting up kitchen for easy access to supplies, preparing meals ahead of time and saving for future use.  Pt required mod questioning cues and planned failure to determine his need for w/c post d/c.  Stood without upper extremity support for football activity tossing and catching the ball working on standing balance and bilateral knee control.  Pt required frequent rest breaks secondary to fatigue and knees buckling. [name] 11/15/2010, 10:34 AM

## 2010-11-16 ENCOUNTER — Inpatient Hospital Stay (HOSPITAL_COMMUNITY): Payer: PRIVATE HEALTH INSURANCE

## 2010-11-16 DIAGNOSIS — G8220 Paraplegia, unspecified: Secondary | ICD-10-CM

## 2010-11-16 DIAGNOSIS — M4712 Other spondylosis with myelopathy, cervical region: Secondary | ICD-10-CM

## 2010-11-16 DIAGNOSIS — I2699 Other pulmonary embolism without acute cor pulmonale: Secondary | ICD-10-CM

## 2010-11-16 LAB — GLUCOSE, CAPILLARY
Glucose-Capillary: 178 mg/dL — ABNORMAL HIGH (ref 70–99)
Glucose-Capillary: 171 mg/dL — ABNORMAL HIGH (ref 70–99)

## 2010-11-16 MED ORDER — OXYCODONE-ACETAMINOPHEN 5-325 MG PO TABS
1.0000 | ORAL_TABLET | ORAL | Status: DC | PRN
  Administered 2010-11-16 – 2010-11-19 (×7): 2 via ORAL
  Filled 2010-11-16 (×8): qty 2

## 2010-11-16 NOTE — Progress Notes (Signed)
Patient tolerating therapies . Patient calls for assist as needed pain managed with percocet . abd wounds intact no dsg applied no draiage noted ant.neck incision with steristrips intact   Reviewed laxative choices with patient lbm 11-2 c- collar on at all times patinent c/o lt big toe pain  Continue with plan of care. Cody Cordova

## 2010-11-16 NOTE — Progress Notes (Signed)
Occupational Therapy Note  Patient Details Name: TERREL NESHEIWAT MRN: [id number] Date of Birth: Jan 26, 1970 Today's Date: 11/16/2010  Focus on dynamic standing balance and activity tolerance; standing activities with r/w using bilateral UE to complete reaching activities.  Pt required rest breaks X 3.  Pt exhibited loss of balance X 1 requiring assistance from therapist to correct.  Individual Therapy  Sun, Wilensky 11/16/2010, 3:02 PM

## 2010-11-16 NOTE — Progress Notes (Signed)
Physical Therapy Session Note  Patient Details  Name: Cody Cordova MRN: [id number] Date of Birth: 05-Jun-1970  Today's Date: 11/16/2010 Time: 4098-1191 Time Calculation (min): 45 min  Precautions: Precautions Precautions: Other (comment) Actuary on at all times) Required Braces or Orthoses: Yes Cervical Brace: Hard collar Restrictions Weight Bearing Restrictions: No  Short Term Goals:    Skilled Therapeutic Interventions/Progress Updates: performed cooking activity from wc level. Performing sit to stands to retrieve items from shelves. Pt. Taught energy conservation, and performed cooking task mod. I from wc.     General   Vital Signs Oxygen Therapy SpO2: 97 % O2 Device: None (Room air) Pain Pain Assessment Pain Assessment: No/denies pain Pain Score:   8 Pain Type: Acute pain Pain Location: Hip Pain Orientation: Right Pain Descriptors: Aching;Constant Pain Onset: On-going Pain Intervention(s): RN made aware (premedicated by nursing) Mobility Transfers Sit to Stand: 5: Supervision Sit to Stand Details:  (pt. tends to push wc back secondary to weak legs) Psychologist, counselling Mobility: Yes Wheelchair Assistance: 6: Modified independent (Device/Increase time) Occupational hygienist: Both lower extermities;Both upper extremities Wheelchair Parts Management: Independent Distance: 150 controlled enviroment and 50 feet home enviroment  Trunk/Postural Assessment     Balance     Exercises    Other Treatments    Therapy/Group: Individual Therapy  Julian Reil 11/16/2010, 11:08 AM

## 2010-11-16 NOTE — Progress Notes (Signed)
Called pt's sister and left message re: d/c date of 11/19/10. Plan to contact her tomorrow to discuss team conference: goals and d/c plans.

## 2010-11-16 NOTE — Discharge Summary (Signed)
NAME:  Cody Cordova, Cody Cordova NO.:  [id number]  MEDICAL RECORD NO.:  [id number]  LOCATION:  4002                         FACILITY:  [facility]  PHYSICIAN:  Nelda Severe, MD      DATE OF BIRTH:  1970-10-24  DATE OF ADMISSION:  10/13/2010 DATE OF DISCHARGE:  11/01/2010                              DISCHARGE SUMMARY   ADMITTING DIAGNOSIS:  Cervical herniated disk C6-7 with myelopathy.  BRIEF HISTORY:  The patient was brought to [facility] on October 23, 2010, by Dr. Nelda Severe direct admit from our office.  He was placed on sliding scale for his diabetes.  We ordered MRI of the cervical spine, carb-modified diet in preparation for surgery.  First surgery attempt was on October 18, 2010.  The patient was placed on the table and it was discovered that he had infection on chest incision that was covered with Tegaderm.  Cultures were taken and sent for anaerobic, aerobic, and Gram stain.  Those cultures came back as MRSA.  We then got a consult from Infectious Disease.  They placed him on vancomycin 1.5 g q. 12 until final cultures.  He was then taken to surgery on October 23, 2010, by Dr. Nelda Severe, underwent ACDF of C6-7 anterior fusion with iliac bone graft harvest.  The patient awakened with inability to move his lower extremities.  A stat MRI was done.  He was then taken back to the operating room at 10:30 that same day on October 23, 2010, for revision of anterior fusion.  The iliac bone graft harvest was removed. Decompression was re-explored.  We then placed a tightening cage in C6- 7.  He began to gain active range of motion significantly in his upper extremities and slowly in his lower extremities.  He was sent to the ICU postoperatively for close monitoring.  He continued to gain function and sensation significantly in the upper extremities, slowly in the lower extremities.  He was continued on vancomycin, and then he was able to be put on p.o. medication  doxycycline per ID which he is continued on doxycycline b.i.d.  __________ dose of the doxycycline for clarification.  We ordered Physical Therapy for mobility.  He wears an Biochemist, clinical.  The wounds are healing well.  There are staples in both the cervical wound as well as the iliac crest graft wound.  The infection is well healed on his sternum along the old incision.  The patient has 4+ strength in his quads, 3+ strength on anterior tib, 4+ on plantar flexion, 5- on bilateral upper extremities.  Sensation in the upper extremities is intact grossly, equal.  Lower extremities he has decreased sensation on the right more so than the left at the feet distally.  Plan is for him to be discharged to see [facility] inpatient rehab for intensive rehabilitation.  CURRENT MEDICATIONS:  He will continue on his current medications that include: 1. Ketoconazole shampoo q.72 hours. 2. Insulin Lantus 40 units b.i.d. 3. Coreg 12.5 mg p.o. b.i.d. 4. Spiriva 18 mcg inhaler daily. 5. Fluconazole/Solu-Medrol. 6. Advair 1 puff b.i.d. 7. Singulair 10 mg p.o. daily. 8. Spironolactone 25 mg p.o. daily. 9. Lexapro  20 mg daily. 10.Lipitor 20 mg daily. 11.Norvasc 5 mg daily. 12.Remeron 15 mg at bedtime. 13.Doxycycline is 100 mg p.o. b.i.d. 14.Decadron was discontinued. 15.Lasix 20 mg p.o. b.i.d. 16.Seroquel 300 mg p.o. at bedtime. 17.Esomeprazole titrate 40 mg p.o. daily. 18.He is on a moderate sliding scale at bedtime. 19.He is taking Nexium from home 40 mg p.o. daily. 20.Ventolin 6 puffs 90 mcg inhaler q.4 p.r.n. 21.Hydrocodone 10/325 1-2 q.4 p.r.n. for pain control.  DISPOSITION:  At this time is stable to discharge to intensive rehab.  DIET:  Carb-modified.  His staples should come out approximately 14 days status post last surgery which was performed on October 23, 2010.  Follow up in our office in approximately 4 weeks from today's date November 01, 2010. Aspen collar to be worn for 3 months  total time.  LISTING DIAGNOSES UPON ADMISSION: 1. Hypertension. 2. Diabetes. 3. Hyperlipidemia. 4. Chronic obstructive pulmonary disease. 5. Chronic rhinitis. 6. Acid reflux. 7. Depression. 8. Nonobstructive coronary artery disease. 9. Obesity. 10.Venous insufficiency.  The patient does wear a CPAP at night but     has refused it for 3 months now.     Lianne Cure, P.A.   ______________________________ Nelda Severe, MD    MC/MEDQ  D:  11/01/2010  T:  11/02/2010  Job:  [id number]

## 2010-11-16 NOTE — Progress Notes (Signed)
Physical Therapy Session Note  Patient Details  Name: Cody Cordova MRN: [id number] Date of Birth: [birth year]  Today's Date: 11/16/2010 Time: 0900-1000 Time Calculation (min): 60 min  Precautions: Precautions Precautions: Other (comment) Actuary on at all times) Required Braces or Orthoses: Yes Cervical Brace: Hard collar Restrictions Weight Bearing Restrictions: No  Short Term Goals:    Skilled Therapeutic Interventions/Progress Updates:  Pt. Is using RW safely. Working on lower extremitiy strenght and endurance     General   Vital Signs Oxygen Therapy SpO2: 97 % O2 Device: None (Room air) Pain Pain Assessment Pain Assessment: 0-10 Pain Score:   8 Pain Type: Chronic pain Pain Location: Hip Pain Orientation: Right Pain Descriptors: Stabbing Pain Onset: With Activity Pain Intervention(s): Medication (See eMAR);Repositioned;Cold applied Multiple Pain Sites: No Mobility Bed Mobility Bed Mobility: No Transfers Transfers: No Sit to Stand: 5: Supervision Sit to Stand Details:  (pt. tends to push wc back secondary to weak legs) Locomotion  Ambulation Ambulation: Yes Ambulation/Gait Assistance: 5: Supervision Assistive device: Rolling walker Ambulation/Gait Assistance Details: Verbal cues for gait pattern Ambulation/Gait Assistance Details (indicate cue type and reason): 50 home enviroment Gait Gait: Yes High Level Ambulation High Level Ambulation: Side stepping Stairs / Additional Locomotion Stairs: Yes Stairs Assistance: 4: Min assist Stairs Assistance Details: Verbal cues for technique Stair Management Technique: Two rails Number of Stairs: 8  Wheelchair Mobility Wheelchair Mobility: Yes Wheelchair Assistance: 6: Modified independent (Device/Increase time) Occupational hygienist: Both lower extermities;Both upper extremities Wheelchair Parts Management: Independent Distance: 150 controlled enviroment and 50 feet home enviroment  Trunk/Postural  Assessment     Balance     Exercises    Other Treatments    Therapy/Group: Individual Therapy  Julian Reil 11/16/2010, 11:55 AM

## 2010-11-16 NOTE — Progress Notes (Signed)
Inpatient RehabilitationTeam Conference Note Date: 11/16/2010   Time: 1:41 PM    Patient Name: Cody Cordova      Medical Record Number: [id number]  Date of Birth: [birth year] Sex: Male         Room/Bed: 4005/4005-01 Payor Info: Payor: Advertising copywriter MEDICARE  Plan: SECURE [employer] DIRECT  Product Type: *No Product type*     Admitting Diagnosis: C6-7 FUSION WITH MYELOPATHY  Admit Date/Time:  11/10/2010  5:39 PM Admission Comments: No comment available   Primary Diagnosis:  Cervical myelopathy Principal Problem: Cervical myelopathy  Patient Active Problem List  Diagnoses Date Noted  . Cervical myelopathy 11/14/2010  . Diabetes mellitus 11/14/2010  . Pulmonary embolism 11/14/2010  . HTN (hypertension) 11/14/2010  . COPD (chronic obstructive pulmonary disease) 11/14/2010  . Anemia associated with acute blood loss 11/14/2010  . MRSA (methicillin resistant Staphylococcus aureus) 11/14/2010    Team Members Present: Physician: Dr. Faith Rogue Case Manager Present: Lutricia Horsfall, RN Social Worker Present: Dossie Der, LCSW PT Present: Karolee Stamps, Judith Blonder, PTA OT Present: Edwin Cap, OT Other (Discipline and Name): Daleen Bo leader, Maurice March coordinator     Current Status/Progress Goal Weekly Team Focus  Medical   recent PE requiring transfer....pulmonary stamina better.  pain recently an issue regarding right hiop  improved pulmonary and pain tolerance with therapy  pain work up...Marland Kitchenxrays pending   Bowel/Bladder   Contient of bowel and bladder  Pt to remain contient of bowel and bladder      Swallow/Nutrition/ Hydration             ADL's   supervision overall for bathing, dressing, toilet transfers and shower transfers  mod I overall except supervision for shower transfer  activity tolerance and safety awareness   Mobility   supervision transfers, min assist balance and gait, supervision controlled, modified independent wc  mobility  mod. I overall gait and wc, supervision steps (strenght only)  endurance and strength   Communication             Safety/Cognition/ Behavioral Observations            Pain   Vicondin 2 tabs q 4hrs, prn  Pain less than 3  Order pain medicaion 1 hr prior to initial therapy session   Skin   Daily drsg change to wound. Daily topical to skin  Pt perform dressing change  Pt return demonstrate dressing change      *See Interdisciplinary Assessment and Plan and progress notes for long and short-term goals  Barriers to Discharge: pain and pulmonary tolerance    Possible Resolutions to Barriers:  aggressive pain mgt, cardiopulmonary training    Discharge Planning/Teaching Needs:  Pt to retrun home with Mom who can not assist. Sister to check in and provide transportation. Pt needs to be supervision-Mod/I to return home      Team Discussion:  Discussion of pt dx, goals, FIM, d/c plan. S - Min A with mod I goals. Plan for d/c 11/9.  Revisions to Treatment Plan: none    Continued Need for Acute Rehabilitation Level of Care: The patient requires daily medical management by a physician with specialized training in physical medicine and rehabilitation for the following conditions: Daily direction of a multidisciplinary physical rehabilitation program to ensure safe treatment while eliciting the highest outcome that is of practical value to the patient.: Yes Daily medical management of patient stability for increased activity during participation in an intensive rehabilitation regime.: Yes Daily analysis of laboratory values and/or  radiology reports with any subsequent need for medication adjustment of medical intervention for : Cardiac problems;Pulmonary problems;Post surgical problems;Neurological problems  Meryl Dare 11/16/2010, 1:41 PM

## 2010-11-16 NOTE — Progress Notes (Signed)
Subjective/Complaints: Continued right hip pain.  Banged left big toe on walker and has pain there also. Review of Systems  Musculoskeletal: Positive for back pain and joint pain.  Neurological: Positive for sensory change and weakness.    Objective: Vital Signs: Blood pressure 132/86, pulse 80, temperature 98.4 F (36.9 C), temperature source Oral, resp. rate 20, height 5\' 11"  (1.803 m), weight 134 kg (295 lb 6.7 oz), SpO2 98.00%. No results found.  Basename 11/15/10 0600 11/14/10 0720  WBC 6.7 5.8  HGB 8.8* 12.0*  HCT 28.7* 35.6*  PLT 159 276   No results found for this basename: NA:2,K:2,CL:2,CO2:2,GLUCOSE:2,BUN:2,CREATININE:2,CALCIUM:2 in the last 72 hours CBG (last 3)   Basename 11/16/10 0710 11/15/10 2105 11/15/10 1637  GLUCAP 88 180* 141*   Lab Results  Component Value Date   INR 2.88* 11/15/2010   INR 2.63* 11/14/2010   INR 2.38* 11/13/2010     Physical Exam: General appearance: alert Head: Normocephalic, without obvious abnormality, atraumatic Eyes: conjunctivae/corneas clear. PERRL, EOM's intact. Fundi benign. Ears: normal TM's and external ear canals both ears Nose: Nares normal. Septum midline. Mucosa normal. No drainage or sinus tenderness. Throat: lips, mucosa, and tongue normal; teeth and gums normal Neck: no adenopathy, no carotid bruit, no JVD, supple, symmetrical, trachea midline and thyroid not enlarged, symmetric, no tenderness/mass/nodules Back: symmetric, no curvature. ROM normal. No CVA tenderness. Resp: clear to auscultation bilaterally Cardio: regular rate and rhythm, S1, S2 normal, no murmur, click, rub or gallop GI: soft, non-tender; bowel sounds normal; no masses,  no organomegaly Extremities: extremities normal, atraumatic, edema both legs and especially left foot 1++, Right hip painful along iliac crest Pulses:  L brachial 2+ R brachial 2+  L radial 2+ R radial 2+  L inguinal 2+ R inguinal 2+  L popliteal 2+ R popliteal 2+  L posterior  tibial 2+ R posterior tibial 2+  L dorsalis pedis 2+ R dorsalis pedis 2+   Skin: Skin color, texture, turgor normal. No rashes or lesions or normal Neurologic: Motor: fair   Incision/Wound: well healed surgical site.  Patient with pain at right groin/ hip.  Tender with touch and ROM.   Assessment/Plan: 1. Functional deficits secondary to cervical myelopathy which require 3+ hours per day of interdisciplinary therapy in a comprehensive inpatient rehab setting. Physiatrist is providing close team supervision and 24 hour management of active medical problems listed below. Physiatrist and rehab team continue to assess barriers to discharge/monitor patient progress toward functional and medical goals. Mobility: Bed Mobility Bed Mobility: Yes Sit to Supine - Left: 3: Mod assist Sit to Supine - Left Details (indicate cue type and reason): required HOB elevated, but pt. has a hospital at home Transfers Transfers: Yes Sit to Stand: 5: Supervision Squat Pivot Transfers: 4: Min Designer, industrial/product Details (indicate cue type and reason): Pt. with difficulty maintatining squat, and leaned too far forward Ambulation/Gait Ambulation/Gait Assistance: 4: Min assist Ambulation Distance (Feet): 15 Feet Assistive device: Rolling walker Gait Pattern: Step-through pattern;Right foot flat;Left foot flat Wheelchair Mobility Wheelchair Mobility: Yes Wheelchair Assistance: 5: Supervision Wheelchair Propulsion: Both lower extermities (slow with both LE, but needs for functional activities) Wheelchair Parts Management: Independent Distance:  (150) ADL:   Cognition: Cognition Overall Cognitive Status: Appears within functional limits for tasks assessed Orientation Level: Oriented X4 Attention: Selective Selective Attention: Appears intact Memory: Appears intact Awareness: Appears intact Safety/Judgment: Appears intact Cognition Orientation Level: Oriented X4  2. Anticoagulation/DVT  prophylaxis with Pharmaceutical: Coumadin   3. Pain Management:robaxin  and  lidoderm patch for right hip/inguinal pain.  See musculoskeletal below Pain better controlled. Patient Active Hospital Problem List: Cervical myelopathy (11/14/2010)    POA: Unknown   Assessment:Cervical   collar in place   Plan:Team conference today. Diabetes mellitus (11/14/2010)    POA: Unknown   Assessment: Blood sugars normalizing.   PlanContinue lantus bid Pulmonary embolism (11/14/2010)    POA: Unknown   Assessment: Coumadin per pharm.INR 2.88 11/5   Plan: Contact pcp to follow on discharge. HTN (hypertension) (11/14/2010)    POA: Unknown   Assessment:  Good control   Plan: aldactone and norvasc COPD (chronic obstructive pulmonary disease) (11/14/2010)    POA: Unknown   Assessment: No shortness of breath   Plan: Singular and advair with spiriva Anemia associated with acute blood loss (11/14/2010)    POA: Unknown   Assessment:  No obvious bleeding.   Plan: Check CBC as needed MRSA (methicillin resistant Staphylococcus aureus) (11/14/2010)    POA: Unknown   Assessment: The current medical regimen is effective;  continue present plan and medications.   Plan: Contact precautions Musculoskeletal:  Check xrays of right pelvis.  Add ice and temporary celebrex.  Change hydrocodone to percocet   6  SWARTZ,ZACHARY T 11/16/2010, 7:59 AM

## 2010-11-16 NOTE — Progress Notes (Signed)
Occupational Therapy Session Note  Patient Details  Name: Cody Cordova MRN: [id number] Date of Birth: [birth year]  Today's Date: 11/16/2010 Time: 9147-8295 Time Calculation (min): 45 min  Precautions: Precautions Precautions: Other (comment) Actuary on at all times) Required Braces or Orthoses: Yes Cervical Brace: Hard collar Restrictions Weight Bearing Restrictions: No  Short Term Goals:    Skilled Therapeutic Interventions/Progress Updates:    ADL retraining including bathing at shower level and dressing with sit to stand from edge of bed.  Pt ambulated to bathroom with r/w for shower transfer.  Pt c/o pain(8/10) in Rt hip.  Md notified. Focus on safety awareness, activity tolerance, dynamic standing balance.  Min verbal cues for safety awareness.  General    Pain Pain Assessment Pain Assessment: No/denies pain Pain Score:   8 Pain Type: Acute pain Pain Location: Hip Pain Orientation: Right Pain Descriptors: Aching;Constant Pain Onset: On-going Pain Intervention(s): RN made aware (premedicated by nursing) ADL ADL Equipment Provided: Reacher;Sock aid;Long-handled sponge Eating: Set up Where Assessed-Eating: Wheelchair Grooming: Modified independent Where Assessed-Grooming: Sitting at sink Upper Body Bathing: Supervision/safety Where Assessed-Upper Body Bathing: Shower Lower Body Bathing: Supervision/safety Where Assessed-Lower Body Bathing: Shower Upper Body Dressing: Setup Where Assessed-Upper Body Dressing: Edge of bed Lower Body Dressing: Supervision/safety Where Assessed-Lower Body Dressing: Edge of bed Film/video editor: Close supervision Film/video editor Method: Designer, industrial/product: Transfer tub bench;Grab bars   Exercises    Other Treatments    Therapy/Group: Individual Therapy  [name], [name] 11/16/2010, 9:40 AM

## 2010-11-16 NOTE — Progress Notes (Addendum)
Occupational Therapy Note  Patient Details  Name: BRACH BIRDSALL MRN: [id number] Date of Birth: Jul 07, 1970 Today's Date: 11/16/2010 1500-1525pm Time calculation: 25 min  1:1 Tx session focusing on d/c planning.  Focus on maneuvering pt's w/c around in home environment, simulated like home environment. Functional Transfers with RW to couch and armed chair. Strengthening with propelling w/c to gym. UE exercises with 1 lb ball focusing on triceps, biceps, deltoids and traps for 15x for 3 sets for strengthening in prep for transfers.   Christorpher, Hisaw 11/16/2010, 3:38 PM

## 2010-11-16 NOTE — Progress Notes (Signed)
Patient Details  Name: Cody Cordova MRN: [id number] Date of Birth: 06-28-1970  Today's Date: 11/16/2010 Time: 9:05-9:35 Time Calculation:  30 minutes     Short Term Goals= Long term goals  Mod I -simple/moderate TR tasks w/c level  Skilled Therapeutic Interventions/Progress Updates: Focused on completing simple meal prep in kitchen w/c level and performing sit to stands PRN with UE support to retrieve needed items from cabinets and refrigerator.  Pt demonstrated good safety during meal preparation, completing task at supervision level for sit to stands and Mod I for w/c mobility in kitchen environment.  Pt with c/o pain in right hip, un-rated, premedicated.  Therapy/Group: Other: co-treat with PT  Activity Level: Moderate: Kitchen task- simple meal prep on stovetop Level of assist: Supervision  [name]  11/16/2010, 11:03 AM

## 2010-11-16 NOTE — Progress Notes (Signed)
Social Work  Met with pt to inform team conference update. Goals supervision-mod/I level and dischagr date 11/9-Friday. He is pleased with the teams recommendation and will be ready for Friday. Discussed DME and followup therapies. He has no pref for either DME or Home health followup. He will let his sister know of discharge date. No other concerns expressed.

## 2010-11-16 NOTE — Progress Notes (Signed)
Subjective/Complaints: No new complaints. Had a good night.  Objective: Vital Signs: Blood pressure 132/86, pulse 80, temperature 98.4 F (36.9 C), temperature source Oral, resp. rate 20, height 5\' 11"  (1.803 m), weight 134 kg (295 lb 6.7 oz), SpO2 98.00%. No results found.  Basename 11/15/10 0600 11/14/10 0720  WBC 6.7 5.8  HGB 8.8* 12.0*  HCT 28.7* 35.6*  PLT 159 276   No results found for this basename: NA:2,K:2,CL:2,CO2:2,GLUCOSE:2,BUN:2,CREATININE:2,CALCIUM:2 in the last 72 hours CBG (last 3)   Basename 11/16/10 0710 11/15/10 2105 11/15/10 1637  GLUCAP 88 180* 141*   Lab Results  Component Value Date   INR 2.88* 11/15/2010   INR 2.63* 11/14/2010   INR 2.38* 11/13/2010     Physical Exam: General appearance: alert Head: Normocephalic, without obvious abnormality, atraumatic Eyes: conjunctivae/corneas clear. PERRL, EOM's intact. Fundi benign. Ears: normal TM's and external ear canals both ears Nose: Nares normal. Septum midline. Mucosa normal. No drainage or sinus tenderness. Throat: lips, mucosa, and tongue normal; teeth and gums normal Neck: no adenopathy, no carotid bruit, no JVD, supple, symmetrical, trachea midline and thyroid not enlarged, symmetric, no tenderness/mass/nodules Back: symmetric, no curvature. ROM normal. No CVA tenderness. Resp: clear to auscultation bilaterally Cardio: regular rate and rhythm, S1, S2 normal, no murmur, click, rub or gallop GI: soft, non-tender; bowel sounds normal; no masses,  no organomegaly Extremities: extremities normal, atraumatic, no cyanosis or edema Pulses:  L brachial 2+ R brachial 2+  L radial 2+ R radial 2+  L inguinal 2+ R inguinal 2+  L popliteal 2+ R popliteal 2+  L posterior tibial 2+ R posterior tibial 2+  L dorsalis pedis 2+ R dorsalis pedis 2+   Skin: Skin color, texture, turgor normal. No rashes or lesions or normal Neurologic: Motor: fair   Incision/Wound: well healed surgical site.  Patient with pain at  right groin/ hip.  Tender with touch and ROM.   Assessment/Plan: 1. Functional deficits secondary to cervical myelopathy which require 3+ hours per day of interdisciplinary therapy in a comprehensive inpatient rehab setting. Physiatrist is providing close team supervision and 24 hour management of active medical problems listed below. Physiatrist and rehab team continue to assess barriers to discharge/monitor patient progress toward functional and medical goals. Mobility: Bed Mobility Bed Mobility: Yes Sit to Supine - Left: 3: Mod assist Sit to Supine - Left Details (indicate cue type and reason): required HOB elevated, but pt. has a hospital at home Transfers Transfers: Yes Sit to Stand: 5: Supervision Squat Pivot Transfers: 4: Min Designer, industrial/product Details (indicate cue type and reason): Pt. with difficulty maintatining squat, and leaned too far forward Ambulation/Gait Ambulation/Gait Assistance: 4: Min assist Ambulation Distance (Feet): 15 Feet Assistive device: Rolling walker Gait Pattern: Step-through pattern;Right foot flat;Left foot flat Wheelchair Mobility Wheelchair Mobility: Yes Wheelchair Assistance: 5: Supervision Wheelchair Propulsion: Both lower extermities (slow with both LE, but needs for functional activities) Wheelchair Parts Management: Independent Distance:  (150) ADL:   Cognition: Cognition Overall Cognitive Status: Appears within functional limits for tasks assessed Orientation Level: Oriented X4 Attention: Selective Selective Attention: Appears intact Memory: Appears intact Awareness: Appears intact Safety/Judgment: Appears intact Cognition Orientation Level: Oriented X4  2. Anticoagulation/DVT prophylaxis with Pharmaceutical: Coumdin   3. Pain Management:norco and robaxin.  Will add lidoderm patch for right hip/inguinal pain Pain better controlled. Patient Active Hospital Problem List: Cervical myelopathy (11/14/2010)    POA: Unknown    Assessment:Cervical   collar in place   Plan:Team conference today. Diabetes mellitus (  11/14/2010)    POA: Unknown   Assessment: Blood sugars normalizing.   PlanContinue lantus bid Pulmonary embolism (11/14/2010)    POA: Unknown   Assessment: Coumadin per pharm.INR 2.88 11/5   Plan: Contact pcp to follow on discharge. HTN (hypertension) (11/14/2010)    POA: Unknown   Assessment:  Good control   Plan: aldactone and norvasc COPD (chronic obstructive pulmonary disease) (11/14/2010)    POA: Unknown   Assessment: No shortness of breath   Plan: Singular and advair with spiriva Anemia associated with acute blood loss (11/14/2010)    POA: Unknown   Assessment:  No obvious bleeding.   Plan: Check CBC as needed MRSA (methicillin resistant Staphylococcus aureus) (11/14/2010)    POA: Unknown   Assessment: The current medical regimen is effective;  continue present plan and medications.   Plan: Contact precautions   6  Ellina Sivertsen J. 11/16/2010, 7:35 AM

## 2010-11-17 DIAGNOSIS — I2699 Other pulmonary embolism without acute cor pulmonale: Secondary | ICD-10-CM

## 2010-11-17 DIAGNOSIS — M4712 Other spondylosis with myelopathy, cervical region: Secondary | ICD-10-CM

## 2010-11-17 DIAGNOSIS — G8220 Paraplegia, unspecified: Secondary | ICD-10-CM

## 2010-11-17 LAB — CBC
MCH: 24.9 pg — ABNORMAL LOW (ref 26.0–34.0)
MCHC: 30.7 g/dL (ref 30.0–36.0)
Platelets: 169 10*3/uL (ref 150–400)

## 2010-11-17 LAB — GLUCOSE, CAPILLARY
Glucose-Capillary: 89 mg/dL (ref 70–99)
Glucose-Capillary: 159 mg/dL — ABNORMAL HIGH (ref 70–99)
Glucose-Capillary: 192 mg/dL — ABNORMAL HIGH (ref 70–99)

## 2010-11-17 LAB — PROTIME-INR
INR: 3.22 — ABNORMAL HIGH (ref 0.00–1.49)
Prothrombin Time: 33.4 seconds — ABNORMAL HIGH (ref 11.6–15.2)

## 2010-11-17 MED ORDER — WARFARIN SODIUM 5 MG PO TABS
5.0000 mg | ORAL_TABLET | Freq: Once | ORAL | Status: AC
  Administered 2010-11-17: 5 mg via ORAL
  Filled 2010-11-17: qty 1

## 2010-11-17 NOTE — Progress Notes (Addendum)
Patient is alert, and calls appropriately for assistance. Requires aspen collar at all times. Able to ambulate with rolling walker. Contient of bowel and bladder- LBM 11/16/2010. Placed Lidoderm patch on (R) flank. Has some lesion healing and open to air.  Participate in all therapies today. Will continue with plan of care.

## 2010-11-17 NOTE — Progress Notes (Signed)
Social Work Met with pt, Mom and sister to inform of team conference. Pt has progressed toward goals of supervision-mod/I level.  Education completed with mom and sister via [employer].  Answered questions regarding home health and equipment. Have found home health that has seen pt previously and have made a referral for PT and RN.  Pt also to receive wheelchair and 3in1 Pt has a tub bench already.  Set for discharge Friday. Pt pleased with his progress.  Family agreeable to plan.

## 2010-11-17 NOTE — Progress Notes (Signed)
Physical Therapy Session Note  Patient Details  Name: Cody Cordova MRN: [id number] Date of Birth: [birth year]  Today's Date: 11/17/2010 Time: 0900-1000 Time Calculation (min): 60 min  Precautions: Precautions Precautions: Other (comment) Actuary on at all times) Required Braces or Orthoses: Yes Cervical Brace: Hard collar Restrictions Weight Bearing Restrictions: No  Short Term Goals:    Skilled Therapeutic Interventions/Progress Updates:  performed planned failure to teach need to use WC in community verses gait secondary to poor endurance in legs and knees buckeling.    General   Vital Signs Oxygen Therapy SpO2: 96 % O2 Device: None (Room air) Pain Pain Assessment Pain Assessment: 0-10 Pain Score:   8 Pain Type: Acute pain Pain Location: Hip Pain Orientation: Right Pain Descriptors: Lambert Mody;Stabbing Pain Frequency: Constant Pain Onset: With Activity Patients Stated Pain Goal: 3 Pain Intervention(s): Medication (See eMAR);Repositioned Multiple Pain Sites: No Mobility Locomotion  Ambulation Ambulation/Gait Assistance: 5: Supervision Ambulation Distance (Feet): 50 Feet (community, outside) Assistive device: Rolling walker Ambulation/Gait Assistance Details (indicate cue type and reason): followed with wc Gait Gait: Yes Wheelchair Mobility Wheelchair Mobility: Yes Wheelchair Assistance: 4: Administrator, sports Details: Verbal cues for Diplomatic Services operational officer: Both upper extremities Wheelchair Parts Management: Independent Distance: ramp in community, mod assist to get up ramp  Trunk/Postural Assessment     Balance     Exercis Other Treatments    Therapy/Group: Individual Therapy  Julian Reil 11/17/2010, 12:15 PM

## 2010-11-17 NOTE — Progress Notes (Signed)
Subjective/Complaints:  right hip pain better.  Banged left big toe feels better also Review of Systems  Neurological: Positive for sensory change and weakness.    Objective: Vital Signs: Blood pressure 135/84, pulse 80, temperature 98.7 F (37.1 C), temperature source Oral, resp. rate 20, height 5\' 11"  (1.803 m), weight 134 kg (295 lb 6.7 oz), SpO2 96.00%. Dg Pelvis 1-2 Views  11/16/2010  *RADIOLOGY REPORT*  Clinical Data: Right iliac crest pain. Bone harvesting for neck surgery on 10/28/2010.  PELVIS - 1-2 VIEW  Comparison: Radiographs dated 10/26/2010  Findings: There are old deformities and dystrophic calcifications around the proximal right femur and in the right femoral shaft and thigh, probably the result of previous fractures.  The hip joints appear normal bilaterally.  There is a small defect in the right superior iliac crest at the site of bone harvesting.  This is an expected appearance.  The remainder of the pelvic bones are normal.  Visualized bowel gas pattern is normal.  IMPRESSION: Evidence of bone harvesting from the right superior iliac crest. Old deformities of the proximal right femur.  Original Report Authenticated By: Gwynn Burly, M.D.    Basename 11/17/10 0025 11/15/10 0600  WBC 5.6 6.7  HGB 8.5* 8.8*  HCT 27.7* 28.7*  PLT 169 159   No results found for this basename: NA:2,K:2,CL:2,CO2:2,GLUCOSE:2,BUN:2,CREATININE:2,CALCIUM:2 in the last 72 hours CBG (last 3)   Basename 11/16/10 2143 11/16/10 1651 11/16/10 1125  GLUCAP 190* 171* 178*   Lab Results  Component Value Date   INR 3.22* 11/17/2010   INR 2.88* 11/15/2010   INR 2.63* 11/14/2010     Physical Exam: General appearance: alert Head: Normocephalic, without obvious abnormality, atraumatic Eyes: conjunctivae/corneas clear. PERRL, EOM's intact. Fundi benign. Ears: normal TM's and external ear canals both ears Nose: Nares normal. Septum midline. Mucosa normal. No drainage or sinus  tenderness. Throat: lips, mucosa, and tongue normal; teeth and gums normal Neck: no adenopathy, no carotid bruit, no JVD, supple, symmetrical, trachea midline and thyroid not enlarged, symmetric, no tenderness/mass/nodules Back: symmetric, no curvature. ROM normal. No CVA tenderness. Resp: clear to auscultation bilaterally Cardio: regular rate and rhythm, S1, S2 normal, no murmur, click, rub or gallop GI: soft, non-tender; bowel sounds normal; no masses,  no organomegaly Extremities: extremities normal, atraumatic, edema both legs and especially left foot 1++, Right hip painful along iliac crest Pulses:  L brachial 2+ R brachial 2+  L radial 2+ R radial 2+  L inguinal 2+ R inguinal 2+  L popliteal 2+ R popliteal 2+  L posterior tibial 2+ R posterior tibial 2+  L dorsalis pedis 2+ R dorsalis pedis 2+   Skin: Skin color, texture, turgor normal. No rashes or lesions or normal Neurologic: Motor: fair   Incision/Wound: well healed surgical site.  Patient with pain at right groin/ hip.  Tender with touch and ROM.   Assessment/Plan: 1. Functional deficits secondary to cervical myelopathy which require 3+ hours per day of interdisciplinary therapy in a comprehensive inpatient rehab setting.  Patient to bring in shoes so that build up or lift can be added. Physiatrist is providing close team supervision and 24 hour management of active medical problems listed below. Physiatrist and rehab team continue to assess barriers to discharge/monitor patient progress toward functional and medical goals. Mobility: Bed Mobility Bed Mobility: No Sit to Supine - Left: 3: Mod assist Sit to Supine - Left Details (indicate cue type and reason): required HOB elevated, but pt. has a hospital at  home Transfers Transfers: No Sit to Stand: 5: Supervision Squat Pivot Transfers: 4: Min Designer, industrial/product Details (indicate cue type and reason): Pt. with difficulty maintatining squat, and leaned too far  forward Ambulation/Gait Ambulation/Gait Assistance: 5: Supervision Ambulation/Gait Assistance Details (indicate cue type and reason): 50 home enviroment Ambulation Distance (Feet): 15 Feet Assistive device: Rolling walker Gait Pattern: Step-through pattern;Right foot flat;Left foot flat Stairs: Yes Stairs Assistance: 4: Min assist Stair Management Technique: Two rails Number of Stairs: 8  Wheelchair Mobility Wheelchair Mobility: Yes Wheelchair Assistance: 6: Modified independent (Device/Increase time) Occupational hygienist: Both lower extermities;Both upper extremities Wheelchair Parts Management: Independent Distance: 150 controlled enviroment and 50 feet home enviroment ADL:   Cognition: Cognition Overall Cognitive Status: Appears within functional limits for tasks assessed Orientation Level: Oriented X4 Attention: Selective Selective Attention: Appears intact Memory: Appears intact Awareness: Appears intact Safety/Judgment: Appears intact Cognition Orientation Level: Oriented X4  2. Anticoagulation/DVT prophylaxis with Pharmaceutical: Coumadin   3. Pain Management:robaxin and  lidoderm patch for right hip/inguinal pain.  See musculoskeletal below Pain better controlled. Patient Active Hospital Problem List: Cervical myelopathy (11/14/2010)    POA: Unknown   Assessment:Cervical   collar in place   Plan:Team conference today. Diabetes mellitus (11/14/2010)    POA: Unknown   Assessment: Blood sugars normalizing although patient is eating more. May need to further adjust insulin based on intake    PlanContinue lantus bid Pulmonary embolism (11/14/2010)    POA: Unknown   Assessment: Coumadin per pharm.INR 2.88 11/5   Plan: Contact pcp to follow on discharge. HTN (hypertension) (11/14/2010)    POA: Unknown   Assessment:  Good control   Plan: aldactone and norvasc COPD (chronic obstructive pulmonary disease) (11/14/2010)    POA: Unknown   Assessment: No shortness of breath    Plan: Singular and advair with spiriva Anemia associated with acute blood loss (11/14/2010)    POA: Unknown   Assessment:  No obvious bleeding.   Plan: Check CBC as needed MRSA (methicillin resistant Staphylococcus aureus) (11/14/2010)    POA: Unknown   Assessment: The current medical regimen is effective;  continue present plan and medications.   Plan: Contact precautions Musculoskeletal:  xrays negative.  Pain likely from prior iliac crest donor site. percocet helpful.  Continue conservative care   7  Cody Cordova T 11/17/2010, 7:10 AM

## 2010-11-17 NOTE — Progress Notes (Signed)
Physical Therapy Session Note  Patient Details  Name: Cody Cordova MRN: [id number] Date of Birth: May 14, 1970  Today's Date: 11/17/2010 Time: 0715-0800 Time Calculation (min): 45 min  Precautions: Precautions Precautions: Other (comment) Actuary on at all times) Required Braces or Orthoses: Yes Cervical Brace: Hard collar Restrictions Weight Bearing Restrictions: No  Short Term Goals:    Skilled Therapeutic Interventions/Progress Updates:      General   Vital Signs Oxygen Therapy SpO2: 96 % O2 Device: None (Room air) Pain Pain Assessment Pain Assessment: 0-10 Pain Score: 10-Worst pain ever Pain Type: Acute pain Pain Location: Hip Pain Orientation: Right Pain Descriptors: Sharp Pain Frequency: Constant Pain Onset: With Activity Patients Stated Pain Goal: 3 Pain Intervention(s): Medication (See eMAR);RN made aware;Cold applied Mobility Bed Mobility Bed Mobility: Yes Sit to Supine - Left: 5: Supervision Transfers Transfers: Yes Squat Pivot Transfers: 5: Supervision Squat Pivot Transfer Details: Verbal cues for technique Squat Pivot Transfer Details (indicate cue type and reason): performed into car supervision Locomotion  Ambulation Ambulation/Gait Assistance: 4: Wheelchair Mobility Wheelchair Mobility: Yes Wheelchair Assistance: 6: Modified independent (Device/Increase time) Wheelchair Propulsion: Both upper extremities;Both lower extermities Wheelchair Parts Management: Independent Distance: 150  Trunk/Postural Assessment     Balance     Exercises General Exercises - Lower Extremity Short Arc Quad: Strengthening;Left;Other reps (comment) Long Arc Quad: Left Heel Slides: 20 reps Hip ABduction/ADduction: Supine Straight Leg Raises: AAROM  Other Treatments    Therapy/Group: Individual Therapy  Julian Reil 11/17/2010, 9:51 AM

## 2010-11-17 NOTE — Progress Notes (Signed)
ANTICOAGULATION CONSULT NOTE - Follow Up Consult  Pharmacy Consult for coumadin  Indication: pulmonary embolus  Allergies  Allergen Reactions  . Iodine Other (See Comments)    hypertension  . Iohexol      Desc: USES 13 HR PREP   . Sulfa Antibiotics     Patient Measurements: Height: 5\' 11"  (180.3 cm) (Entered for [employer]) Weight: 295 lb 6.7 oz (134 kg) (Entered for [employer]) IBW/kg (Calculated) : 75.3  Body Weight:  Basename 11/17/10 0025 11/17/10 0024 11/15/10 0600  HGB 8.5* -- 8.8*  HCT 27.7* -- 28.7*  PLT 169 -- 159  APTT -- -- --  LABPROT -- 33.4* 30.6*  INR -- 3.22* 2.88*  HEPARINUNFRC -- -- --  CREATININE -- -- --  CKTOTAL -- -- --  CKMB -- -- --  TROPONINI -- -- --   Estimated Creatinine Clearance: 171.5 ml/min (by C-G formula based on Cr of 0.62).       Assessment: INR 3.22, slightly > 2-3 goal, no bleeding reported  Plan: coumadin 5mg  today, daily a INRs.  Len Childs T 11/17/2010,

## 2010-11-17 NOTE — Progress Notes (Signed)
Occupational Therapy Note  Patient Details  Name: Cody Cordova MRN: [id number] Date of Birth: Feb 05, 1970 Today's Date: 11/17/2010  Demonstrated donning/doffing Aspen Cervical Collar to pt's sister and mother.  Sister verbalized understanding. Focus on dynamic standing balance and safety awareness with r/w to complete therapeutic activities and increase Independence with ADLs.  Pt exhibited LOB X 2 and able to self correct.  Individual Therapy  [name], [name] 11/17/2010, 3:21 PM

## 2010-11-17 NOTE — Progress Notes (Signed)
Occupational Therapy Session Note  Patient Details  Name: Cody Cordova MRN: [id number] Date of Birth: February 18, 1970  Today's Date: 11/17/2010 Time: 0715-0800 Time Calculation (min): 45 min  Precautions: Precautions Precautions: Other (comment) Actuary on at all times) Required Braces or Orthoses: Yes Cervical Brace: Hard collar Restrictions Weight Bearing Restrictions: No  Short Term Goals:    Skilled Therapeutic Interventions/Progress Updates:    ADL retaining including bathing at shower level and dressing at w/c level at sink.  Pt ambulated to bathroom for transfer to tub bench in walk-in shower utilizing r/w with supervision.  Focus on dynamic standing balance, safety awareness with r/w and w/c, and activity tolerlance.  Pt completed all tasks with supervision.  General   Pain Pain Assessment Pain Assessment: No/denies pain ADL ADL Equipment Provided: Reacher;Long-handled sponge Grooming: Setup;Supervision/safety Where Assessed-Grooming: Sitting at sink Upper Body Bathing: Supervision/safety Where Assessed-Upper Body Bathing: Shower Lower Body Bathing: Supervision/safety Where Assessed-Lower Body Bathing: Shower Upper Body Dressing: Supervision/safety Where Assessed-Upper Body Dressing: Sitting at sink Lower Body Dressing: Supervision/safety Where Assessed-Lower Body Dressing: Standing at sink;Sitting at sink [employer]: Close supervision Film/video editor Method: [employer]: Transfer tub bench;Grab bars   Exercises    Other Treatments    Therapy/Group: Individual Therapy  Elroy, Schembri 11/17/2010, 9:24 AM

## 2010-11-18 LAB — GLUCOSE, CAPILLARY: Glucose-Capillary: 176 mg/dL — ABNORMAL HIGH (ref 70–99)

## 2010-11-18 MED ORDER — LIDOCAINE 5 % EX PTCH: 1.0000 | MEDICATED_PATCH | CUTANEOUS | Status: AC

## 2010-11-18 MED ORDER — MIRTAZAPINE 15 MG PO TABS: 15.0000 mg | ORAL_TABLET | Freq: Every day | ORAL | Status: AC

## 2010-11-18 MED ORDER — ATORVASTATIN CALCIUM 20 MG PO TABS: 20.0000 mg | ORAL_TABLET | Freq: Every day | ORAL | Status: DC

## 2010-11-18 MED ORDER — AMLODIPINE BESYLATE 5 MG PO TABS: 5.0000 mg | ORAL_TABLET | Freq: Every day | ORAL | Status: DC

## 2010-11-18 MED ORDER — INSULIN GLARGINE 100 UNIT/ML ~~LOC~~ SOLN
45.0000 [IU] | Freq: Every day | SUBCUTANEOUS | Status: DC
  Administered 2010-11-19: 45 [IU] via SUBCUTANEOUS

## 2010-11-18 MED ORDER — WARFARIN SODIUM 5 MG PO TABS
5.0000 mg | ORAL_TABLET | Freq: Once | ORAL | Status: AC
  Administered 2010-11-18: 5 mg via ORAL
  Filled 2010-11-18: qty 1

## 2010-11-18 MED ORDER — ESCITALOPRAM OXALATE 20 MG PO TABS: 20.0000 mg | ORAL_TABLET | Freq: Every day | ORAL | Status: DC

## 2010-11-18 MED ORDER — HYDROCODONE-ACETAMINOPHEN 10-325 MG PO TABS: 1.0000 | ORAL_TABLET | Freq: Four times a day (QID) | ORAL | Status: AC | PRN

## 2010-11-18 MED ORDER — SPIRONOLACTONE 25 MG PO TABS: 25.0000 mg | ORAL_TABLET | Freq: Every day | ORAL | Status: AC

## 2010-11-18 MED ORDER — QUETIAPINE FUMARATE 300 MG PO TABS: 300.0000 mg | ORAL_TABLET | Freq: Every day | ORAL | Status: DC

## 2010-11-18 MED ORDER — ROSUVASTATIN CALCIUM 10 MG PO TABS: 10.0000 mg | ORAL_TABLET | Freq: Every day | ORAL | Status: AC

## 2010-11-18 MED ORDER — INSULIN GLARGINE 100 UNIT/ML ~~LOC~~ SOLN
10.0000 [IU] | Freq: Every day | SUBCUTANEOUS | Status: DC
  Administered 2010-11-18: 10 [IU] via SUBCUTANEOUS

## 2010-11-18 MED ORDER — METHOCARBAMOL 500 MG PO TABS: 500.0000 mg | ORAL_TABLET | Freq: Four times a day (QID) | ORAL | Status: AC | PRN

## 2010-11-18 MED ORDER — INSULIN GLARGINE 100 UNIT/ML ~~LOC~~ SOLN: 45.0000 [IU] | Freq: Every day | SUBCUTANEOUS | Status: AC

## 2010-11-18 MED ORDER — CARVEDILOL 12.5 MG PO TABS: 12.5000 mg | ORAL_TABLET | Freq: Two times a day (BID) | ORAL | Status: DC

## 2010-11-18 MED ORDER — FLUTICASONE-SALMETEROL 250-50 MCG/DOSE IN AEPB: 1.0000 | INHALATION_SPRAY | Freq: Two times a day (BID) | RESPIRATORY_TRACT | Status: DC

## 2010-11-18 MED ORDER — INSULIN GLARGINE 100 UNIT/ML ~~LOC~~ SOLN: 10.0000 [IU] | Freq: Every day | SUBCUTANEOUS | Status: AC

## 2010-11-18 NOTE — Progress Notes (Signed)
Physical Therapy Session Note  Patient Details  Name: Cody Cordova MRN: [id number] Date of Birth: 1970-02-12  Today's Date: 11/18/2010 Time: 4098-1191 Pain 5/10- premedicated Precautions: Precautions Precautions: Other (comment) (Aspen Collar on at all times) Required Braces or Orthoses: Yes Cervical Brace: Hard collar Restrictions Weight Bearing Restrictions: No      Skilled Therapeutic Interventions/Progress Updates: Focus of treatment: reviewing LTGs fjor DC tomorrow    General   Vital Signs 02 sats >92% ra Pain 5/10 meds given    Locomotion Gait training 50 feet RW supervision; wc mobility Mod I unit        Balance (dynamic standing balance) without rw Close supervision (reaching activity) Exercises   m Other Treatments  Nustep L3 X 20 minutes to improve tolerance to activity  Therapy/Group: Individual Therapy  Carley Glendenning,JIM 11/18/2010, 9:36 AM

## 2010-11-18 NOTE — Progress Notes (Deleted)
Occupational Therapy Discharge Summary  Patient Details  Name: DENNIES COATE MRN: [id number] Date of Birth: 1970-03-23 Today's Date: 11/18/2010  Patient has met 8 of 8 long term goals.Pt has made steady progress this admission.  Pt is independent with grooming and upper body dressing; mod I with bathing, lower body dressing, toilet transfers and toileting, and simple meal prep; supervision for tub transfers to tub bench.  Pt will d/c home with mother and family members providing assistance PRN for home mgmt tasks.  Recommendation:    Equipment: Equipment provided: [facility]  Patient/family agrees with progress made and goals achieved: Yes  Jarret, Torre 11/18/2010, 9:12 AM

## 2010-11-18 NOTE — Progress Notes (Signed)
Occupational Therapy Treatment Note and Discharge Summary  Patient Details  Name: Cody Cordova MRN: [id number] Date of Birth: 07-06-1970 Today's Date: 11/18/2010  ADL retraining including bathing and dressing at tub/shower level with sit to stand from tub bench.  Pt ambulated with RW for tub transfer.  Focus on safety awareness and activity tolerance.  Pt exhibited no unsafe behaviors throughout session.  Patient has met 8 of 8 long term goals.  Pt has made steady progress this admission and will d/c home with mother and family providing PRN assistance with home management tasks.  Pt is independent with grooming and upper body dressing; mod I with bathing, lower body dressing, toilet transfers and toileting, and simple meal prep; supervision with tub transfers using tub bench.  Recommendation:  No additional occupational therapy recommended at this time.    Equipment: Equipment provided: [facility]  Patient/family agrees with progress made and goals achieved: Yes  Edgar, Reisz 11/18/2010, 10:46 AM

## 2010-11-18 NOTE — Progress Notes (Signed)
ANTICOAGULATION CONSULT NOTE - Follow Up Consult  Pharmacy Consult for Coumadin Indication: pulmonary embolus  Allergies  Allergen Reactions  . Iodine Other (See Comments)    hypertension  . Iohexol      Desc: USES 13 HR PREP   . Sulfa Antibiotics     Patient Measurements: Height: 5\' 11"  (180.3 cm) (Entered for [employer]) Weight:  (patient weight 305.2lbs) IBW/kg (Calculated) : 75.3    Vital Signs: Temp: 97.8 F (36.6 C) (11/08 0615) Temp src: Oral (11/08 0615) BP: 142/87 mmHg (11/08 0615) Pulse Rate: 63  (11/08 0615)  Labs:  Alvira Philips 11/18/10 0658 11/17/10 0025 11/17/10 0024  HGB -- 8.5* --  HCT -- 27.7* --  PLT -- 169 --  APTT -- -- --  LABPROT 32.5* -- 33.4*  INR 3.11* -- 3.22*  HEPARINUNFRC -- -- --  CREATININE -- -- --  CKTOTAL -- -- --  CKMB -- -- --  TROPONINI -- -- --   Estimated Creatinine Clearance: 171.5 ml/min (by C-G formula based on Cr of 0.62).   Medications:  Scheduled:    . amLODipine  5 mg Oral Daily  . aspirin EC  81 mg Oral Daily  . calcipotriene-betamethasone   Topical Daily  . carvedilol  12.5 mg Oral BID WC  . doxycycline  100 mg Oral BID  . escitalopram  20 mg Oral Daily  . Fluticasone-Salmeterol  1 puff Inhalation BID  . insulin aspart  0-9 Units Subcutaneous TID WC  . insulin glargine  10 Units Subcutaneous QHS  . insulin glargine  45 Units Subcutaneous Daily  . lidocaine  1 patch Transdermal Q24H  . mirtazapine  15 mg Oral QHS  . montelukast  10 mg Oral Daily  . pantoprazole  40 mg Oral BID  . QUEtiapine  300 mg Oral QHS  . rosuvastatin  10 mg Oral q1800  . senna  2 tablet Oral QHS  . spironolactone  25 mg Oral Daily  . tiotropium  18 mcg Inhalation Daily  . warfarin  5 mg Oral ONCE-1800  . DISCONTD: insulin glargine  15 Units Subcutaneous QHS  . DISCONTD: insulin glargine  40 Units Subcutaneous Daily  . DISCONTD: warfarin  10 mg Oral q1800    Assessment:   40yo male with PE.  INR slightly supratherapeutic, dose  adjusted yesterday.  INR 3.11 this AM, moving toward goal.  No problems noted.   Goal of Therapy:  INR 2-3   Plan:  1.  Repeat Coumadin 5mg  2.  F/U AM  Dessie Delcarlo P 11/18/2010,1:51 PM

## 2010-11-18 NOTE — Progress Notes (Signed)
Therapeutic Recreation Discharge Summary Patient Details  Name: Cody Cordova MRN: [id number] Date of Birth: February 19, 1970  Long term goals set: 2  Long term goals met: 2  Comments on progress toward goals: Pt has made great progress toward goals meeting Modified Independent level for TR tasks and supervision to [employer] for [employer].  Pt required extra time to complete tasks safely as well as frequent rest breaks.  Pt verbalized understanding of energy conservation techniques and has plans in place for safe mobility at household/community level.  Pt d/c'd home tomorrow.  Reasons goals not met: n/a   Reason for discharge: treatment goals met   Patient/family agrees with progress made and goals achieved: Yes  [name] 11/18/2010, 1:12 PM

## 2010-11-18 NOTE — Progress Notes (Signed)
Physical Therapy Discharge Summary  Patient Details  Name: Cody Cordova MRN: [id number] Date of Birth: 04/27/1970 Today's Date: 11/18/2010  Treatment: 1415-1530 (premedicated for pain) with session focusing on gait training in controlled and home environment emphasis on safety and energy conservation, strengthening, general activity tolerance, simulated car transfer, dynamic balance, stair training for strength and coordination, and fall prevention. See Doc Flowsheets and discharge tab for complete details on assist levels.   Patient has met 9 of 9 long term goals due to improved activity tolerance, improved balance, improved postural control, increased strength, decreased pain, ability to compensate for deficits, functional use of  Lower extremeties and improved coordination.  Patient to discharge at an ambulatory level Modified Independent for short distances with RW and modified independent level for w/c mobility.   Recommendation:  Patient will benefit from ongoing skilled PT services in home health setting to continue to advance safe functional mobility, address ongoing impairments in gait, balance, activity tolerance, functional mobility, strength, and minimize fall risk.  Equipment: Equipment provided: RW and w/c  Patient/family agrees with progress made and goals achieved: Yes  Tedd Sias 11/18/2010, 3:50 PM

## 2010-11-18 NOTE — Progress Notes (Signed)
Patient Details  Name: Cody Cordova MRN: [id number] Date of Birth: [birth year]  Today's Date: 11/18/2010 Time: 0930-1000 Time Calculation (min): 30 min   Skilled Therapeutic Interventions/Progress Updates: Pt participated in leisure activity seated with Modified Independence given extra time.  Reviewed energy conservation techniques with patients.  Pt Modified Independent with w/c mobility on unit and negotiating obstacles.  Also discussed community reintegration including identification of barriers and ways to negotiate.  Pt Modified Independent for TR tasks and states excitement about discharge home tomorrow.  Therapy/Group: Individual Therapy  Activity Level: Moderate:  Level of assist: Modified Independent  Lashante Fryberger 11/18/2010, 12:22 PM

## 2010-11-18 NOTE — Progress Notes (Signed)
Per State Regulation 482.30 This chart was reviewed for medical necessity with respect to the patient's Admission/Duration of stay. Pt has continued to participate in therapies and has met goals. No barriers noted to d/c tomorrow.   Meryl Dare                 Nurse Care Manager              Next Review Date:

## 2010-11-18 NOTE — Discharge Summary (Signed)
  Job number for discharge summary *[id number]. Please refer to for full details of rehab course.

## 2010-11-18 NOTE — Progress Notes (Signed)
Pt min A wc. Toilets self. Feeds self. Voids to urinal, requires staff assist to empty due to documenting volume. BM to toilet self. Pain controlled with current medication regimen. Aspen collar in place. Skin dry flaky. Pt administers skin cream to self for psoriasis. Meds whole with water. Pt participating to therapies. Continue plan of care.

## 2010-11-18 NOTE — Plan of Care (Signed)
Problem: RH PAIN MANAGEMENT Goal: RH STG PAIN MANAGED AT OR BELOW PT'S PAIN GOAL Pt stated goal is 3 or less

## 2010-11-18 NOTE — Discharge Summary (Signed)
NAME:  Cody Cordova, Cody Cordova NO.:  [id number]  MEDICAL RECORD NO.:  [id number]  LOCATION:  4005                         FACILITY:  [facility]  PHYSICIAN:  Ranelle Oyster, M.D.DATE OF BIRTH:  1970-02-05  DATE OF ADMISSION:  11/10/2010 DATE OF DISCHARGE:  11/19/2010                              DISCHARGE SUMMARY   DISCHARGE DIAGNOSES: 1. Cervical myelopathy with cervical C6-7 decompression. 2. Pain management. 3. Pulmonary embolism with chronic Coumadin. 4. Hypertension. 5. Diabetes mellitus. 6. Obstructive sleep apnea. 7. Depression. 8. Methicillin-resistant Staph aureus with contact precautions.  This is a 40 year old male admitted October 3 to [facility] with progressive quadriparesis.  X-rays and imaging showed a large central disk herniation at cervical C6-7.  Surgery was delayed secondary to multiple skin lesions.  Infectious Disease was consulted.  Culture of wound with positive MRSA, placed on intravenous vancomycin and contact precautions.  He was cleared for surgeon, underwent anterior decompression October 13 by Dr. Alveda Reasons.  He is placed in an Aspen cervical collar and weaned from Decadron therapy.  His intravenous vancomycin was also completed.  He was admitted for comprehensive rehab program.  He was noted during his rehab stay on October 28.  He developed chest pain.  CT angiogram of the chest showed pulmonary emboli to the right upper lobe and lower lobe.  He is placed on Lovenox and Coumadin therapy.  Cardiac panel was negative.  PAST MEDICAL HISTORY:  See discharge diagnoses.  SOCIAL HISTORY:  Currently, lives with his mother who can assist on discharge, one level home with 3 steps to entry.  FUNCTIONAL HISTORY:  Prior to surgery was independent with a walker prior to admission.  Functional status upon admission to rehab services was minimum to moderate assist for ambulation, as well as moderate assist for activities of daily  living.  FAMILY HISTORY:  Positive for coronary artery disease.  HOME MEDICATIONS: 1. Lexapro 20 mg daily. 2. Remeron 15 mg at bedtime. 3. Spiriva 18 mcg daily. 4. Opana 30 mg every 6 hours. 5. Seroquel 300 mg daily. 6. Coreg 12.5 mg twice daily. 7. Robaxin twice daily. 8. Norvasc 5 mg daily. 9. Lipitor 20 mg daily. 10.Lantus insulin 80 units twice daily. 11.NovoLog 10-50 units 3 times daily. 12.Singulair 10 mg daily. 13.Lasix 40 mg daily. 14.Aldactone 25 mg daily. 15.Norco as needed. 16.Nexium daily. 17.Potassium 20 mg daily. 18.Advair inhaler twice daily.  PHYSICAL EXAM:  VITAL SIGNS:  Blood pressure 128/77, pulse 77, respirations 14, temperature 97. GENERAL:  This was an alert male, in no acute distress.  Sitting in a chair with cervical collar in place.  Pupils reactive to light. NECK:  Supple with cervical collar. LUNGS:  Clear to auscultation. CARDIAC:  Regular rate and rhythm. ABDOMEN:  Soft, nontender.  Good bowel sounds. SKIN:  Generally intact except for surgical site which was clean and dry with Steri-Strips in place.  REHABILITATION HOSPITAL COURSE:  The patient was admitted to inpatient rehab services with therapies initiated on a 3 hour daily basis consisting of occupational therapy, physical therapy, and rehabilitation nursing.  The following issues were addressed during the patient's rehabilitation stay.  Pertaining to Mr. Feenstra's cervical myelopathy, he  had undergone cervical C6-7 posterior anterior decompression per Dr. Alveda Reasons, tolerated procedure well, and surgical site healing nicely. During his hospital course, noted pulmonary emboli, placed on Lovenox Coumadin therapy.  No bleeding episodes noted.  He would remain on Coumadin therapy for proxy 6 months to 1 year, to be followed by his primary MD on discharge.  His blood pressures were well controlled on Norvasc, Coreg, and Aldactone with no orthostatic changes.  He did have a history of diabetes  mellitus, blood sugars with some variables, he received full diabetic teaching.  He remained on Lantus insulin. Obstructive sleep apnea as well as chronic obstructive pulmonary disease with multiple inhalers.  Oxygen saturations remained greater than 90% on room air.  He did have a history of depression, continued on Seroquel Remeron Lexapro.  He remained very abide his overall therapies.  Contact precautions for MRSA.  Remaining afebrile.  Pain management with good results on Vicodin and Robaxin.  The patient received weekly collaborative interdisciplinary team conferences to discuss estimated length of stay, family teaching, and any barriers to discharge.  He was supervision overall for bathing, dressing, toilet transfers, and shower transfers, supervision for his functional transfers, was minimal assist for balance, ambulation, supervision was controlled, modified independence with a wheelchair for his mobility.  Home health therapies would be ongoing as per rehab services.  He was continent of bowel and bladder.  LATEST LABS:  Showed an INR of 3.11 with a goal INR of 2-3.  Hemoglobin 8.5, hematocrit 27, platelet 169,000.  Chemistries were unremarkable.  DISCHARGE MEDICATIONS: 1. Norvasc 5 mg daily. 2. Aspirin 81 mg daily. 3. Lipitor 20 mg daily. 4. Coreg 12.5 mg two times daily. 5. Lexapro 20 mg daily. 6. Advair 250-50 Diskus one puff twice daily. 7. Hydrocodone one tablet every 6 hours as needed for pain. 8. Lantus insulin 45 units in a.m., 10 units in p.m. 9. Lidoderm patch applied to affected area, remove every 12 hours. 10.Robaxin 500 mg every 6 hours as needed for muscle spasms. 11.Remeron 15 mg daily. 12.Singulair 10 mg daily. 13.Seroquel 300 mg daily. 14.Crestor 10 mg daily. 15.Coumadin currently at 5 mg daily, adjusted accordingly for an INR     of 2.3 to 3.0.  DIET:  Diabetic diet.  SPECIAL INSTRUCTIONS:  Continue Aspen collar as advised.  Follow up Dr. Alveda Reasons.   The patient was advised no driving.  Home health therapies had been arranged as per [employer].     Mariam Dollar, P.A.   ______________________________ Ranelle Oyster, M.D.    DA/MEDQ  D:  11/18/2010  T:  11/18/2010  Job:  [id number]

## 2010-11-18 NOTE — Progress Notes (Signed)
Subjective/Complaints:  pain much better controlled Review of Systems  Neurological: Positive for sensory change and weakness.    Objective: Vital Signs: Blood pressure 142/87, pulse 63, temperature 97.8 F (36.6 C), temperature source Oral, resp. rate 20, height 5\' 11"  (1.803 m), weight 134 kg (295 lb 6.7 oz), SpO2 95.00%. Dg Pelvis 1-2 Views  11/16/2010  *RADIOLOGY REPORT*  Clinical Data: Right iliac crest pain. Bone harvesting for neck surgery on 10/28/2010.  PELVIS - 1-2 VIEW  Comparison: Radiographs dated 10/26/2010  Findings: There are old deformities and dystrophic calcifications around the proximal right femur and in the right femoral shaft and thigh, probably the result of previous fractures.  The hip joints appear normal bilaterally.  There is a small defect in the right superior iliac crest at the site of bone harvesting.  This is an expected appearance.  The remainder of the pelvic bones are normal.  Visualized bowel gas pattern is normal.  IMPRESSION: Evidence of bone harvesting from the right superior iliac crest. Old deformities of the proximal right femur.  Original Report Authenticated By: Gwynn Burly, M.D.    Basename 11/17/10 0025  WBC 5.6  HGB 8.5*  HCT 27.7*  PLT 169   No results found for this basename: NA:2,K:2,CL:2,CO2:2,GLUCOSE:2,BUN:2,CREATININE:2,CALCIUM:2 in the last 72 hours CBG (last 3)   Basename 11/18/10 0713 11/17/10 2132 11/17/10 1643  GLUCAP 78 192* 159*   Lab Results  Component Value Date   INR 3.22* 11/17/2010   INR 2.88* 11/15/2010   INR 2.63* 11/14/2010     Physical Exam: General appearance: alert Head: Normocephalic, without obvious abnormality, atraumatic Eyes: conjunctivae/corneas clear. PERRL, EOM's intact. Fundi benign. Ears: normal TM's and external ear canals both ears Nose: Nares normal. Septum midline. Mucosa normal. No drainage or sinus tenderness. Throat: lips, mucosa, and tongue normal; teeth and gums normal Neck: no  adenopathy, no carotid bruit, no JVD, supple, symmetrical, trachea midline and thyroid not enlarged, symmetric, no tenderness/mass/nodules Back: symmetric, no curvature. ROM normal. No CVA tenderness. Resp: clear to auscultation bilaterally Cardio: regular rate and rhythm, S1, S2 normal, no murmur, click, rub or gallop GI: soft, non-tender; bowel sounds normal; no masses,  no organomegaly Extremities: extremities normal, atraumatic, edema both legs and especially left foot 1++, Right hip painful along iliac crest Pulses:  L brachial 2+ R brachial 2+  L radial 2+ R radial 2+  L inguinal 2+ R inguinal 2+  L popliteal 2+ R popliteal 2+  L posterior tibial 2+ R posterior tibial 2+  L dorsalis pedis 2+ R dorsalis pedis 2+   Skin: Skin color, texture, turgor normal. No rashes or lesions or normal Neurologic: Motor: fair    Improved motor in lower exts....3-4/5  Incision/Wound: well healed surgical site.  Patient with pain at right groin/ hip.  Tender with touch and ROM.   Assessment/Plan: 1. Functional deficits secondary to cervical myelopathy which require 3+ hours per day of interdisciplinary therapy in a comprehensive inpatient rehab setting.  Patient to bring in shoes so that build up or lift can be added. Physiatrist is providing close team supervision and 24 hour management of active medical problems listed below. Physiatrist and rehab team continue to assess barriers to discharge/monitor patient progress toward functional and medical goals. Mobility: Bed Mobility Bed Mobility: Yes Sit to Supine - Left: 5: Supervision Sit to Supine - Left Details (indicate cue type and reason): required HOB elevated, but pt. has a hospital at home Transfers Transfers: Yes Sit to Stand:  5: Supervision Squat Pivot Transfers: 5: Supervision Squat Pivot Transfer Details (indicate cue type and reason): performed into car supervision Ambulation/Gait Ambulation/Gait Assistance: 5:  Supervision Ambulation/Gait Assistance Details (indicate cue type and reason): followed with wc Ambulation Distance (Feet): 50 Feet (community, outside) Assistive device: Rolling walker Gait Pattern: Step-through pattern;Right foot flat;Left foot flat Stairs: Yes Stairs Assistance: 4: Min assist Stair Management Technique: Two rails Number of Stairs: 8  Naval architect Mobility: Yes Wheelchair Assistance: 4: Systems analyst: Both upper extremities Wheelchair Parts Management: Independent Distance: ramp in community, mod assist to get up ramp ADL:   Cognition: Cognition Overall Cognitive Status: Appears within functional limits for tasks assessed Orientation Level: Oriented X4 Attention: Selective Selective Attention: Appears intact Memory: Appears intact Awareness: Appears intact Safety/Judgment: Appears intact Cognition Orientation Level: Oriented X4  2. Anticoagulation/DVT prophylaxis with Pharmaceutical: Coumadin   3. Pain Management:robaxin and  lidoderm patch for right hip/inguinal pain.  See musculoskeletal below Pain better controlled. Patient happy with pain control at present.  Continues these meds at home. Patient Active Hospital Problem List: Cervical myelopathy (11/14/2010)    POA: Unknown   Assessment:Cervical   collar in place   Plan:Team conference today. Diabetes mellitus (11/14/2010)    POA: Unknown   Assessment: Blood sugars normalizing although patient is eating more. May need to further adjust insulin based on intake.  Will adjust am and pm lantus today.    PlanContinue lantus bid Pulmonary embolism (11/14/2010)    POA: Unknown   Assessment: Coumadin per pharm.INR 2.88 11/5   Plan: Contact pcp to follow on discharge. HTN (hypertension) (11/14/2010)    POA: Unknown   Assessment:  Good control   Plan: aldactone and norvasc COPD (chronic obstructive pulmonary disease) (11/14/2010)    POA: Unknown   Assessment: No shortness of  breath   Plan: Singular and advair with spiriva Anemia associated with acute blood loss (11/14/2010)    POA: Unknown   Assessment:  No obvious bleeding.   Plan: Check CBC as needed MRSA (methicillin resistant Staphylococcus aureus) (11/14/2010)    POA: Unknown   Assessment: The current medical regimen is effective;  continue present plan and medications.   Plan: Contact precautions Musculoskeletal:  xrays negative.  Pain likely from prior iliac crest donor site. percocet helpful.  Continue conservative care   8  Cody Cordova 11/18/2010, 7:59 AM

## 2010-11-19 LAB — GLUCOSE, CAPILLARY

## 2010-11-19 MED ORDER — WARFARIN SODIUM 5 MG PO TABS: 5.0000 mg | ORAL_TABLET | Freq: Every day | ORAL | Status: AC

## 2010-11-19 NOTE — Progress Notes (Signed)
Subjective/Complaints:  No complaints and anxious for discharge. Review of Systems  Neurological: Positive for sensory change and weakness.    Objective: Vital Signs: Blood pressure 148/89, pulse 92, temperature 99.1 F (37.3 C), temperature source Oral, resp. rate 18, height 5\' 11"  (1.803 m), weight 134 kg (295 lb 6.7 oz), SpO2 97.00%. No results found.  Basename 11/17/10 0025  WBC 5.6  HGB 8.5*  HCT 27.7*  PLT 169   No results found for this basename: NA:2,K:2,CL:2,CO2:2,GLUCOSE:2,BUN:2,CREATININE:2,CALCIUM:2 in the last 72 hours CBG (last 3)   Basename 11/18/10 2139 11/18/10 1702 11/18/10 1112  GLUCAP 205* 206* 176*   Lab Results  Component Value Date   INR 3.11* 11/18/2010   INR 3.22* 11/17/2010   INR 2.88* 11/15/2010     Physical Exam: General appearance: alert Head: Normocephalic, without obvious abnormality, atraumatic Eyes: conjunctivae/corneas clear. PERRL, EOM's intact. Fundi benign. Ears: normal TM's and external ear canals both ears Nose: Nares normal. Septum midline. Mucosa normal. No drainage or sinus tenderness. Throat: lips, mucosa, and tongue normal; teeth and gums normal Neck: no adenopathy, no carotid bruit, no JVD, supple, symmetrical, trachea midline and thyroid not enlarged, symmetric, no tenderness/mass/nodules Back: symmetric, no curvature. ROM normal. No CVA tenderness. Resp: clear to auscultation bilaterally Cardio: regular rate and rhythm, S1, S2 normal, no murmur, click, rub or gallop GI: soft, non-tender; bowel sounds normal; no masses,  no organomegaly Extremities: extremities normal, atraumatic, edema both legs and especially left foot 1++, Right hip painful along iliac crest Pulses:  L brachial 2+ R brachial 2+  L radial 2+ R radial 2+  L inguinal 2+ R inguinal 2+  L popliteal 2+ R popliteal 2+  L posterior tibial 2+ R posterior tibial 2+  L dorsalis pedis 2+ R dorsalis pedis 2+   Skin: Skin color, texture, turgor normal. No  rashes or lesions or normal Neurologic: Motor: fair    Improved motor in lower exts....3-4/5  Incision/Wound: well healed surgical site.  Patient with pain at right groin/ hip.  Tender with touch and ROM.   Assessment/Plan: 1. Functional deficits secondary to cervical myelopathy which require 3+ hours per day of interdisciplinary therapy in a comprehensive inpatient rehab setting.  Patient to bring in shoes so that build up or lift can be added. Physiatrist is providing close team supervision and 24 hour management of active medical problems listed below. Physiatrist and rehab team continue to assess barriers to discharge/monitor patient progress toward functional and medical goals. Mobility: Bed Mobility Bed Mobility: Yes Supine to Sit: 6: Modified independent (Device/Increase time) Sit to Supine - Left: 6: Modified independent (Device/Increase time) Sit to Supine - Left Details (indicate cue type and reason): required HOB elevated, but pt. has a hospital at home Transfers Transfers: Yes Sit to Stand: 6: Modified independent (Device/Increase time) Stand Pivot Transfers: 6: Modified independent (Device/Increase time) Squat Pivot Transfers: 6: Modified independent (Device/Increase time) Squat Pivot Transfer Details (indicate cue type and reason): performed into car supervision Ambulation/Gait Ambulation/Gait Assistance: 6: Modified independent (Device/Increase time) Ambulation/Gait Assistance Details (indicate cue type and reason): followed with wc Ambulation Distance (Feet): 70 Feet (25' in home environment) Assistive device: Rolling walker Gait Pattern: Left steppage;Right steppage;Right genu recurvatum;Left genu recurvatum (mild steppage pattern noted bilaterally with gait and stairs) Stairs: Yes Stairs Assistance: 5: Supervision Stair Management Technique: Two rails Number of Stairs: 5  Wheelchair Mobility Wheelchair Mobility: Yes Wheelchair Assistance: 6: Modified independent  (Device/Increase time) Occupational hygienist: Both upper extremities Wheelchair Parts Management: Independent  Distance: 160 ADL:   Cognition: Cognition Overall Cognitive Status: Appears within functional limits for tasks assessed Arousal/Alertness: Awake/alert Orientation Level: Oriented X4 Attention: Selective Selective Attention: Appears intact Memory: Appears intact Awareness: Appears intact Safety/Judgment: Appears intact Cognition Arousal/Alertness: Awake/alert Orientation Level: Oriented X4  2. Anticoagulation/DVT prophylaxis with Pharmaceutical: Coumadin . Plan home health nurse to check INR on 11/13 with results to Tiffany Plunk at [phone].  3. Pain Management:robaxin and  lidoderm patch for right hip/inguinal pain.  See musculoskeletal below Pain better controlled. Patient happy with pain control at present.  Continues these meds at home. Patient Active Hospital Problem List: Cervical myelopathy (11/14/2010)    POA: Unknown   Assessment:Cervical   collar in place    Diabetes mellitus (11/14/2010)    POA: Unknown   Assessment: Blood sugars normalizing although patient is eating more. May need to further adjust insulin based on intake.  Diabetic teaching completed.Follow up with PCP.   Continue lantus bid Pulmonary embolism (11/14/2010)    POA: Unknown   Assessment: Coumadin per pharm.INR 3.11 11/8   Plan: Contact pcp to follow on discharge. HTN (hypertension) (11/14/2010)    POA: Unknown   Assessment:  Good control   Plan: aldactone and norvasc COPD (chronic obstructive pulmonary disease) (11/14/2010)    POA: Unknown   Assessment: No shortness of breath   Plan: Singular and advair with spiriva Anemia associated with acute blood loss (11/14/2010)    POA: Unknown   Assessment:  No obvious bleeding.   Plan: Check CBC as needed MRSA (methicillin resistant Staphylococcus aureus) (11/14/2010)    POA: Unknown   Assessment: The current medical regimen is effective;  continue  present plan and medications.   Plan: Contact precautions Musculoskeletal:  xrays negative.  Pain likely from prior iliac crest donor site. percocet helpful.  Continue conservative care     ANGIULLI,DANIEL J. 11/19/2010, 5:57 AM

## 2010-11-19 NOTE — Progress Notes (Signed)
Social Work Discharge Note Discharge Note  The overall goal for the admission was met for:   Discharge location: Yes-Home with Mom sister to check on  Length of Stay: Yes-10 days  Discharge activity level: Yes-supervision/mod/i level Home/community participation: Yes  Services provided included: MD, RD, PT, OT, RN, CM, TR, Pharmacy and SW  Financial Services: Private Insurance: [employer]-Medicare  Follow-up services arranged: Home Health: [facility]  Comments (or additional information):[facility]-DME wheelchair, rolling walker, 3in1  Patient/Family verbalized understanding of follow-up arrangements: Yes  Individual responsible for coordination of the follow-up plan:Pt and Janice-sister Confirmed correct DME delivered: Lucy Chris 11/19/2010    Lucy Chris

## 2010-11-19 NOTE — Progress Notes (Addendum)
Pt d/c'ed today to home c friend at 1100. Pt left c rolling walker, w/c, and BSC. D/C instructions given by D. Anguilli, PA. Pt verbalized understanding. Pt in good spirits, denies pain.

## 2010-11-19 NOTE — Progress Notes (Addendum)
Social Work Discharge Note Discharge Note  The overall goal for the admission was met for: Home with mother, sister to check in and out  Discharge location: Yes--supervision-mod/i level  Length of Stay: Yes-10 days  Discharge activity level: Yes  Home/community participation: Yes  Services provided included: MD, RD, PT, OT, RN, CM, TR, Pharmacy and SW  Financial Services: Private Insurance: [employer]  Follow-up services arranged: Home Health: [facility]  Comments (or additional information):  Patient/Family verbalized understanding of follow-up arrangements: Yes  Individual responsible for coordination of the follow-up plan: Self and Janice-sister  Confirmed correct DME delivered: Lucy Chris 11/19/2010  [facility]-wheelchair, rolling walker and 3in1 [phone]  Lucy Chris

## 2010-12-27 ENCOUNTER — Encounter: Payer: PRIVATE HEALTH INSURANCE | Admitting: Physical Medicine & Rehabilitation

## 2011-01-04 ENCOUNTER — Inpatient Hospital Stay (HOSPITAL_COMMUNITY): Payer: PRIVATE HEALTH INSURANCE

## 2011-01-04 ENCOUNTER — Encounter (HOSPITAL_COMMUNITY): Payer: Self-pay | Admitting: General Practice

## 2011-01-04 ENCOUNTER — Inpatient Hospital Stay (HOSPITAL_COMMUNITY)
Admission: EM | Admit: 2011-01-04 | Discharge: 2011-01-06 | DRG: 208 | Disposition: A | Discharge status: Home Health | Payer: PRIVATE HEALTH INSURANCE | Source: Other Acute Inpatient Hospital | Attending: Pulmonary Disease | Admitting: Pulmonary Disease

## 2011-01-04 DIAGNOSIS — E119 Type 2 diabetes mellitus without complications: Secondary | ICD-10-CM

## 2011-01-04 DIAGNOSIS — G4700 Insomnia, unspecified: Secondary | ICD-10-CM | POA: Diagnosis present

## 2011-01-04 DIAGNOSIS — I2699 Other pulmonary embolism without acute cor pulmonale: Secondary | ICD-10-CM

## 2011-01-04 DIAGNOSIS — Z888 Allergy status to other drugs, medicaments and biological substances status: Secondary | ICD-10-CM

## 2011-01-04 DIAGNOSIS — I10 Essential (primary) hypertension: Secondary | ICD-10-CM

## 2011-01-04 DIAGNOSIS — Z9911 Dependence on respirator [ventilator] status: Secondary | ICD-10-CM

## 2011-01-04 DIAGNOSIS — Z882 Allergy status to sulfonamides status: Secondary | ICD-10-CM

## 2011-01-04 DIAGNOSIS — M549 Dorsalgia, unspecified: Secondary | ICD-10-CM | POA: Diagnosis present

## 2011-01-04 DIAGNOSIS — J9600 Acute respiratory failure, unspecified whether with hypoxia or hypercapnia: Secondary | ICD-10-CM | POA: Diagnosis present

## 2011-01-04 DIAGNOSIS — J9620 Acute and chronic respiratory failure, unspecified whether with hypoxia or hypercapnia: Principal | ICD-10-CM | POA: Diagnosis present

## 2011-01-04 DIAGNOSIS — G9340 Encephalopathy, unspecified: Secondary | ICD-10-CM

## 2011-01-04 DIAGNOSIS — G959 Disease of spinal cord, unspecified: Secondary | ICD-10-CM

## 2011-01-04 DIAGNOSIS — G8929 Other chronic pain: Secondary | ICD-10-CM | POA: Diagnosis present

## 2011-01-04 DIAGNOSIS — D62 Acute posthemorrhagic anemia: Secondary | ICD-10-CM

## 2011-01-04 DIAGNOSIS — R7309 Other abnormal glucose: Secondary | ICD-10-CM | POA: Diagnosis present

## 2011-01-04 DIAGNOSIS — A4902 Methicillin resistant Staphylococcus aureus infection, unspecified site: Secondary | ICD-10-CM

## 2011-01-04 DIAGNOSIS — Z86718 Personal history of other venous thrombosis and embolism: Secondary | ICD-10-CM

## 2011-01-04 HISTORY — DX: Major depressive disorder, single episode, unspecified: F32.9

## 2011-01-04 HISTORY — DX: Anemia, unspecified: D64.9

## 2011-01-04 HISTORY — DX: Gastro-esophageal reflux disease without esophagitis: K21.9

## 2011-01-04 HISTORY — DX: Carrier or suspected carrier of methicillin resistant Staphylococcus aureus: Z22.322

## 2011-01-04 HISTORY — DX: Heart failure, unspecified: I50.9

## 2011-01-04 HISTORY — DX: Sleep apnea, unspecified: G47.30

## 2011-01-04 HISTORY — DX: Depression, unspecified: F32.A

## 2011-01-04 HISTORY — DX: Other pulmonary embolism without acute cor pulmonale: I26.99

## 2011-01-04 HISTORY — DX: Disease of spinal cord, unspecified: G95.9

## 2011-01-04 LAB — URINE DRUGS OF ABUSE SCREEN W ALC, ROUTINE (REF LAB)
Benzodiazepines.: NEGATIVE
Cocaine Metabolites: NEGATIVE
Creatinine,U: 154.1 mg/dL
Methadone: NEGATIVE
Phencyclidine (PCP): NEGATIVE

## 2011-01-04 LAB — POCT I-STAT 3, ART BLOOD GAS (G3+)
Acid-Base Excess: 5 mmol/L — ABNORMAL HIGH (ref 0.0–2.0)
Bicarbonate: 30.8 mEq/L — ABNORMAL HIGH (ref 20.0–24.0)
O2 Saturation: 93 %
Patient temperature: 99
TCO2: 32 mmol/L (ref 0–100)

## 2011-01-04 LAB — BASIC METABOLIC PANEL
BUN: 14 mg/dL (ref 6–23)
CO2: 28 mEq/L (ref 19–32)
Calcium: 8.3 mg/dL — ABNORMAL LOW (ref 8.4–10.5)
Creatinine, Ser: 0.79 mg/dL (ref 0.50–1.35)
Glucose, Bld: 137 mg/dL — ABNORMAL HIGH (ref 70–99)

## 2011-01-04 LAB — CARDIAC PANEL(CRET KIN+CKTOT+MB+TROPI)
Relative Index: 1.2 (ref 0.0–2.5)
Relative Index: 1 (ref 0.0–2.5)
Total CK: 325 U/L — ABNORMAL HIGH (ref 7–232)
Total CK: 303 U/L — ABNORMAL HIGH (ref 7–232)
Total CK: 284 U/L — ABNORMAL HIGH (ref 7–232)

## 2011-01-04 LAB — GLUCOSE, CAPILLARY
Glucose-Capillary: 162 mg/dL — ABNORMAL HIGH (ref 70–99)
Glucose-Capillary: 200 mg/dL — ABNORMAL HIGH (ref 70–99)
Glucose-Capillary: 181 mg/dL — ABNORMAL HIGH (ref 70–99)

## 2011-01-04 LAB — CBC
MCH: 23.3 pg — ABNORMAL LOW (ref 26.0–34.0)
MCV: 78.9 fL (ref 78.0–100.0)
Platelets: 283 10*3/uL (ref 150–400)
RDW: 17.4 % — ABNORMAL HIGH (ref 11.5–15.5)

## 2011-01-04 LAB — MRSA PCR SCREENING: MRSA by PCR: POSITIVE — AB

## 2011-01-04 LAB — DIFFERENTIAL
Basophils Absolute: 0 10*3/uL (ref 0.0–0.1)
Eosinophils Absolute: 0 10*3/uL (ref 0.0–0.7)
Eosinophils Relative: 0 % (ref 0–5)
Lymphs Abs: 0.9 10*3/uL (ref 0.7–4.0)

## 2011-01-04 LAB — PROCALCITONIN: Procalcitonin: <0.1 ng/mL

## 2011-01-04 MED ORDER — NON FORMULARY: 2.0000 mg | Freq: Once | Status: DC

## 2011-01-04 MED ORDER — INSULIN ASPART 100 UNIT/ML ~~LOC~~ SOLN
0.0000 [IU] | SUBCUTANEOUS | Status: DC
  Administered 2011-01-04: 3 [IU] via SUBCUTANEOUS
  Administered 2011-01-04: 2 [IU] via SUBCUTANEOUS
  Administered 2011-01-04: 3 [IU] via SUBCUTANEOUS
  Administered 2011-01-04: 2 [IU] via SUBCUTANEOUS
  Administered 2011-01-04: 3 [IU] via SUBCUTANEOUS
  Filled 2011-01-04: qty 3

## 2011-01-04 MED ORDER — BIOTENE DRY MOUTH MT LIQD
15.0000 mL | Freq: Two times a day (BID) | OROMUCOSAL | Status: DC
  Administered 2011-01-05: 15 mL via OROMUCOSAL

## 2011-01-04 MED ORDER — TRAMADOL HCL 50 MG PO TABS
50.0000 mg | ORAL_TABLET | Freq: Four times a day (QID) | ORAL | Status: DC | PRN
  Administered 2011-01-04 – 2011-01-06 (×5): 50 mg via ORAL
  Filled 2011-01-04 (×7): qty 1

## 2011-01-04 MED ORDER — IPRATROPIUM-ALBUTEROL 18-103 MCG/ACT IN AERO
6.0000 | INHALATION_SPRAY | RESPIRATORY_TRACT | Status: DC
  Administered 2011-01-04 (×4): 6 via RESPIRATORY_TRACT
  Filled 2011-01-04: qty 14.7

## 2011-01-04 MED ORDER — RIVAROXABAN 10 MG PO TABS: 10.0000 mg | ORAL_TABLET | Freq: Every day | ORAL | Status: DC

## 2011-01-04 MED ORDER — PANTOPRAZOLE SODIUM 40 MG IV SOLR
40.0000 mg | INTRAVENOUS | Status: DC
  Administered 2011-01-04: 40 mg via INTRAVENOUS
  Filled 2011-01-04 (×2): qty 40

## 2011-01-04 MED ORDER — IPRATROPIUM-ALBUTEROL 18-103 MCG/ACT IN AERO: 6.0000 | INHALATION_SPRAY | RESPIRATORY_TRACT | Status: DC | PRN

## 2011-01-04 MED ORDER — CHLORHEXIDINE GLUCONATE 0.12 % MT SOLN
15.0000 mL | Freq: Two times a day (BID) | OROMUCOSAL | Status: DC
  Administered 2011-01-04: 15 mL via OROMUCOSAL
  Filled 2011-01-04 (×3): qty 15

## 2011-01-04 MED ORDER — RIVAROXABAN 10 MG PO TABS
20.0000 mg | ORAL_TABLET | Freq: Every day | ORAL | Status: DC
  Administered 2011-01-04 – 2011-01-06 (×3): 20 mg via ORAL
  Filled 2011-01-04 (×3): qty 2

## 2011-01-04 MED ORDER — ACETAMINOPHEN 500 MG PO TABS
1000.0000 mg | ORAL_TABLET | Freq: Four times a day (QID) | ORAL | Status: DC | PRN
  Administered 2011-01-04 – 2011-01-06 (×5): 1000 mg via ORAL
  Filled 2011-01-04 (×5): qty 2

## 2011-01-04 MED ORDER — ESZOPICLONE 2 MG PO TABS
2.0000 mg | ORAL_TABLET | Freq: Once | ORAL | Status: AC
  Administered 2011-01-05: 2 mg via ORAL

## 2011-01-04 MED ORDER — PROPOFOL 10 MG/ML IV EMUL
INTRAVENOUS | Status: AC
  Administered 2011-01-04: 1000 mg via INTRAVENOUS
  Filled 2011-01-04: qty 100

## 2011-01-04 MED ORDER — MUPIROCIN 2 % EX OINT
1.0000 "application " | TOPICAL_OINTMENT | Freq: Two times a day (BID) | CUTANEOUS | Status: DC
  Administered 2011-01-04 – 2011-01-06 (×5): 1 via NASAL
  Filled 2011-01-04 (×2): qty 22

## 2011-01-04 MED ORDER — SODIUM CHLORIDE 0.9 % IV SOLN
INTRAVENOUS | Status: DC
  Administered 2011-01-04: 03:00:00 via INTRAVENOUS

## 2011-01-04 MED ORDER — PROPOFOL 10 MG/ML IV EMUL: 5.0000 ug/kg/min | INTRAVENOUS | Status: DC

## 2011-01-04 MED ORDER — CHLORHEXIDINE GLUCONATE CLOTH 2 % EX PADS
6.0000 | MEDICATED_PAD | Freq: Every day | CUTANEOUS | Status: DC
  Administered 2011-01-04: 6 via TOPICAL

## 2011-01-04 MED ORDER — METHYLPREDNISOLONE SODIUM SUCC 40 MG IJ SOLR
20.0000 mg | INTRAMUSCULAR | Status: AC
  Administered 2011-01-04 (×4): 20 mg via INTRAVENOUS
  Filled 2011-01-04 (×4): qty 0.5

## 2011-01-04 MED ORDER — SODIUM CHLORIDE 0.9 % IV SOLN
INTRAVENOUS | Status: DC
  Administered 2011-01-04: 05:00:00 via INTRAVENOUS

## 2011-01-04 MED ORDER — BIOTENE DRY MOUTH MT LIQD
15.0000 mL | Freq: Four times a day (QID) | OROMUCOSAL | Status: DC
  Administered 2011-01-04 (×3): 15 mL via OROMUCOSAL

## 2011-01-04 NOTE — Progress Notes (Signed)
eLink Physician-Brief Progress Note Patient Name: Cody Cordova DOB: [birth year] MRN: [id number]  Date of Service  01/04/2011   HPI/Events of Note   Still with pain despite tylenol  Cody Cordova  Cody Cordova   Intervention Category Intermediate Cordova: Pain - evaluation and management  Arel Tippen 01/04/2011, 8:02 PM

## 2011-01-04 NOTE — Progress Notes (Signed)
PCCM Progress Note  Assessed patient's resp status, he looks appropriate for extubation. Acknowledge that he is a risky airway due to size, recent C-spine procedure, C-collar. Extubated successfully with airway box available, will follow closely.  Levy Pupa, MD, PhD 01/04/2011, 9:45 AM [place] [facility] [phone] or if no answer [phone]

## 2011-01-04 NOTE — Progress Notes (Signed)
eLink Physician-Brief Progress Note Patient Name: JUNO BOZARD DOB: 05-08-1970 MRN: [id number]  Date of Service  01/04/2011   HPI/Events of Note   1. Chronic back pain - on opana at home. Now rates chronic pain as 10 of 10. Wanting something. Says tylenol ok  2. Res failure - extubated earlier. Looks well. Watching TV  eICU Interventions  1. Try tylenol. If fails, start opioids 2. Autp cpap qhs (takes it at home)   Intervention Category Intermediate Interventions: Pain - evaluation and management;Respiratory distress - evaluation and management  Mickie Kozikowski 01/04/2011, 6:21 PM

## 2011-01-04 NOTE — Progress Notes (Signed)
Pt placed on CPAP per MD order in Auto mode using FFM and 2L O2 bleed in. Pt comfortable and tolerating well for now. RT will continue to monitor.

## 2011-01-04 NOTE — H&P (Signed)
Name: Cody Cordova MRN: [id number] DOB: 10-Nov-1970    LOS: 0  PCCM ADMISSION NOTE  History of Present Illness: 40 yo AAM with recent neck surgery for cervical myelopathy complicated by pulmonary embolism (reportedly resistant to Coumadin, recently started on Rivaroxoban), OSA (? OHS) on CPAP (?BiPAP) and congestive heart failure brought to [facility] in AM 12/24 wit respiratory distress.  Intubated for hypercarbic respiratory failure (pH 7.33, pCO2 65, pO2 275).  Reportedly difficult intubation requiring bougie, also some bleeding from EMS attempting nasal airway.  Upon arrival to [facility] is off sedation, breathing spontaneously, following commands.  Lines / Drains: 12/24  ETT (difficult intubation) 12/24  Foley  Cultures: None  Antibiotics: 12/24  Clindamycin x1   Tests / Events: 12/25  Transferred from  [place] where he was intubated for hypercarbic respiratory failure  The patient is sedated, intubated and unable to provide history, which was obtained for available medical records.    Past Medical History  Diagnosis Date  . Diabetes mellitus   . Hypertension   . Asthma   . COPD (chronic obstructive pulmonary disease)   . CAD (coronary artery disease)   . Obesity   . Venous insufficiency    No past surgical history on file. Prior to Admission medications   Medication Sig Start Date End Date Taking? Authorizing Provider  escitalopram (LEXAPRO) 20 MG tablet Take 1 tablet (20 mg total) by mouth daily. 11/18/10   Mcarthur Rossetti. Angiulli, PA  montelukast (SINGULAIR) 10 MG tablet Take 10 mg by mouth at bedtime.      Historical Provider, MD  tiotropium (SPIRIVA) 18 MCG inhalation capsule Place 18 mcg into inhaler and inhale daily.      Historical Provider, MD   Allergies Allergies  Allergen Reactions  . Iodine Other (See Comments)    hypertension  . Iohexol      Desc: USES 13 HR PREP   . Sulfa Antibiotics     Family History No family history on file.  Social  History  does not have a smoking history on file. He does not have any smokeless tobacco history on file. His alcohol and drug histories not on file.  Review Of Systems  11 points review of systems is negative with an exception of listed in HPI.  Vital Signs: Temp:  [99.8 F (37.7 C)] 99.8 F (37.7 C) (12/25 0220) Pulse Rate:  [92-93] 93  (12/25 0230) Resp:  [19-20] 20  (12/25 0230) BP: (125-143)/(61-83) 125/61 mmHg (12/25 0230) SpO2:  [100 %] 100 % (12/25 0230) FiO2 (%):  [39.9 %-59.6 %] 39.9 % (12/25 0230) Weight:  [148 kg (326 lb 4.5 oz)] 326 lb 4.5 oz (148 kg) (12/25 0220)   Physical Examination: General:  Morbidly obese, mechanically ventilated, not in acute distress Neuro:  Awake, alert, nonverbal, follows commands   HEENT:  PERRL, ETT Neck:  Neck collar is on   Cardiovascular:  RRR, no M/R/G Lungs:  Bilateral diminished air entry, no W/R/R Abdomen:  Obese, bowel sounds present Musculoskeletal:  Bilateral lower extremities edema  Skin:  Multiple skin lesions resembling healed skin abscesses and multiple healed surgical scars  Ventilator settings: Vent Mode:  [-]  FiO2 (%):  [39.9 %-59.6 %] 39.9 %  Labs and Imaging:  Reviewed.  Please refer to the Assessment and Plan section for relevant results.  Assessment and Plan:  Encephalopathy (metabolic, hypercarbic, medications induced).  Now awake, alert, following commands. -->reevaluate the need for Lexapro, Abilify, Lunesta, Remeron (preadmission) after extubation -->minimal Propofol  Acute on chronic hypercarbic respiratory failure, likely multifactorial (asthma, OSA/OHS, cervical myelopathy).  High risk airway (anatomy, neck immobility, history of difficult intubation, recent intubation).  No evidence of acute bronchospasm. -->maintain intubated -->may use spontaneous mode of ventilation -->IV steroids prior to extubation (20mg  x 4 every 4 hrs) -->BiPAP after extubation -->bronchodilators -->Singulair after extubation  (pre-admission) -->cardiac enzymes -->drug screen -->CXR/ABG  Recent pulmonary embolism on Rivaroxoban -->restart -->consult Pharmacy  Cervical myelopathy s/p C6-C7 decompression -->neck collar on -->consult Neurosurgery in AM -->defer further imaging for now  History of congestive heart failure, hypertension.  No evidence of acute exacerbation.  Normotensive now. -->restart Carvedilol, Lisinopril, Amlodipine, Spironolactone when able to take PO  History of diabetes, risk of hyperglycemia while on steroids -->SSI  Best practices / Disposition -->ICU status under PCCM -->full code -->DVT Px is not indicated -->Protonix for GI Px -->ventilator bundle -->NPO -->family updated at bedside  The patient is critically ill with multiple organ systems failure and requires high complexity decision making for assessment and support, frequent evaluation and titration of therapies, application of advanced monitoring technologies and extensive interpretation of multiple databases. Critical Care Time devoted to patient care services described in this note is 45 minutes.  Orlean Bradford, M.D. Pulmonary and Critical Care Medicine [facility] Cell: [phone] Pager: [phone]  01/04/2011, 2:45 AM

## 2011-01-04 NOTE — Progress Notes (Signed)
Pt removed C-collar and refused to put it back on.

## 2011-01-04 NOTE — Progress Notes (Signed)
CRITICAL VALUE ALERT  Critical value received:  +MRSA nasal swab  Date of notification:  01/04/11  Time of notification:  0339  Critical value read back:yes  Nurse who received alert:  Dawson Bills  MD notified (1st page):  Dr. Marin Shutter  Time of first page:  0340  MD notified (2nd page):  Time of second page:  Responding MD:  Dr. Marin Shutter  Time MD responded:  [phone]

## 2011-01-04 NOTE — Progress Notes (Signed)
Pt extubated to 4L [place] per MD orders. Pt is stable.RT will continue.

## 2011-01-04 NOTE — Progress Notes (Signed)
eLink Physician-Brief Progress Note Patient Name: Cody Cordova DOB: September 05, 1970 MRN: [id number]  Date of Service  01/04/2011   HPI/Events of Note   Insomnia demanding his home lunesta  eICU Interventions  lunesta tonight      Aroura Vasudevan 01/04/2011, 11:37 PM

## 2011-01-04 NOTE — Progress Notes (Signed)
ANTICOAGULATION CONSULT NOTE - Initial Consult  Pharmacy Consult for Xarelto Indication: pulmonary embolus  Allergies  Allergen Reactions  . Iodine Other (See Comments)    hypertension  . Iohexol      Desc: USES 13 HR PREP   . Sulfa Antibiotics     Patient Measurements: Height: 5\' 11"  (180.3 cm) Weight: 326 lb 4.5 oz (148 kg) IBW/kg (Calculated) : 75.3   Vital Signs: Temp: 99 F (37.2 C) (12/25 0400) Temp src: Oral (12/25 0400) BP: 116/59 mmHg (12/25 0500) Pulse Rate: 96  (12/25 0315)  Labs: No results found for this basename: HGB:2,HCT:3,PLT:3,APTT:3,LABPROT:3,INR:3,HEPARINUNFRC:3,CREATININE:3,CKTOTAL:3,CKMB:3,TROPONINI:3 in the last 72 hours Estimated Creatinine Clearance: 179.4 ml/min (by C-G formula based on Cr of 0.62).  Medical History: Past Medical History  Diagnosis Date  . Hypertension   . Asthma   . COPD (chronic obstructive pulmonary disease)   . CAD (coronary artery disease)   . Obesity   . Venous insufficiency   . Pulmonary embolus   . Anemia   . CHF (congestive heart failure)   . Sleep apnea     CPAP at home  . Depression   . GERD (gastroesophageal reflux disease)   . Diabetes mellitus     insulin dependent  . MRSA (methicillin resistant staph aureus) culture positive 10/12    skin lesions  . Cervical myelopathy   . Difficult intubation     intubated at [facility] 01/03/11    Medications:  Scheduled:    . albuterol-ipratropium  6 puff Inhalation Q4H  . antiseptic oral rinse  15 mL Mouth Rinse QID  . chlorhexidine  15 mL Mouth Rinse BID  . Chlorhexidine Gluconate Cloth  6 each Topical Daily  . insulin aspart  0-15 Units Subcutaneous Q4H  . methylPREDNISolone (SOLU-MEDROL) injection  20 mg Intravenous Q4H  . mupirocin ointment  1 application Nasal BID  . pantoprazole (PROTONIX) IV  40 mg Intravenous Q24H  . propofol      . rivaroxaban  20 mg Oral Daily  . DISCONTD: rivaroxaban  10 mg Oral Daily   Infusions:    . sodium chloride 100 mL/hr at  01/04/11 0437  . propofol 17 mg (01/04/11 0332)  . DISCONTD: sodium chloride 20 mL/hr at 01/04/11 0454    Assessment: 40yo male admitted from [place] s/p intubation for hypercarbic respiratory failure, to continue Xarelto for PE with Coumadin failure; some bleeding associated with intubation.   Plan:  Will continue Xarelto 20mg  daily and monitor for continued bleeding.  Colleen Can PharmD BCPS 01/04/2011,5:12 AM

## 2011-01-05 ENCOUNTER — Encounter: Payer: Self-pay | Admitting: Orthopedic Surgery

## 2011-01-05 DIAGNOSIS — G9340 Encephalopathy, unspecified: Secondary | ICD-10-CM

## 2011-01-05 DIAGNOSIS — E119 Type 2 diabetes mellitus without complications: Secondary | ICD-10-CM

## 2011-01-05 DIAGNOSIS — J9600 Acute respiratory failure, unspecified whether with hypoxia or hypercapnia: Secondary | ICD-10-CM | POA: Diagnosis present

## 2011-01-05 DIAGNOSIS — I2699 Other pulmonary embolism without acute cor pulmonale: Secondary | ICD-10-CM

## 2011-01-05 LAB — GLUCOSE, CAPILLARY
Glucose-Capillary: 117 mg/dL — ABNORMAL HIGH (ref 70–99)
Glucose-Capillary: 121 mg/dL — ABNORMAL HIGH (ref 70–99)
Glucose-Capillary: 143 mg/dL — ABNORMAL HIGH (ref 70–99)

## 2011-01-05 MED ORDER — ALBUTEROL SULFATE (5 MG/ML) 0.5% IN NEBU: 2.5000 mg | INHALATION_SOLUTION | RESPIRATORY_TRACT | Status: DC | PRN

## 2011-01-05 MED ORDER — ESCITALOPRAM OXALATE 20 MG PO TABS
20.0000 mg | ORAL_TABLET | Freq: Every day | ORAL | Status: DC
Start: 2011-01-05 — End: 2011-01-06
  Administered 2011-01-05 – 2011-01-06 (×2): 20 mg via ORAL
  Filled 2011-01-05 (×2): qty 1

## 2011-01-05 MED ORDER — PANTOPRAZOLE SODIUM 40 MG PO TBEC: 40.0000 mg | DELAYED_RELEASE_TABLET | Freq: Every day | ORAL | Status: DC

## 2011-01-05 NOTE — Progress Notes (Signed)
Pt no longer required foley catheter per protocol standards.  Foley removed at 2100 using proper technique.  Will continue to monitor intake & output strictly.

## 2011-01-05 NOTE — Progress Notes (Signed)
Name: Cody Cordova MRN: [id number] DOB: [birth year]    LOS: 1  PCCM PROGRESS NOTE  PT PROFILE: 40 yo AAM with recent neck surgery for cervical myelopathy complicated by pulmonary embolism (reportedly resistant to Coumadin, recently started on Rivaroxoban), OSA (? OHS) on CPAP (?BiPAP) and congestive heart failure brought to [facility] in AM 12/24 wit respiratory distress.  Intubated for hypercarbic respiratory failure (pH 7.33, pCO2 65, pO2 275).  Reportedly difficult intubation requiring bougie, also some bleeding from EMS attempting nasal airway.  Upon arrival to [facility] is off sedation, breathing spontaneously, following commands.  Lines / Drains: 12/24  ETT (difficult intubation) >> 12/25 12/24  Foley >> 12/25  Cultures: None  Antibiotics: 12/24  Clindamycin x1   .  Vital Signs: Temp:  [97.3 F (36.3 C)-99.5 F (37.5 C)] 98.7 F (37.1 C) (12/26 0800) Pulse Rate:  [80-89] 80  (12/26 0906) Resp:  [19-32] 19  (12/26 0906) BP: (106-141)/(62-80) 123/75 mmHg (12/26 0906) SpO2:  [93 %-100 %] 100 % (12/26 0906) I/O last 3 completed shifts: In: 3076.2 [P.O.:360; I.V.:2706.2; IV Piggyback:10] Out: 3500 [Urine:3500] Physical Examination: General:  Morbidly obese, not in acute distress Neuro:  Awake, alert, nonverbal, follows commands   HEENT:  PERRL, ETT Neck:  No JVD noted   Cardiovascular:  RRR, no M/R/G Lungs:  Clrear anteriorly Abdomen:  Obese, bowel sounds present Musculoskeletal:  2+ bilateral lower extremities edema  Skin:  Multiple skin lesions resembling healed skin abscesses and multiple healed surgical scars   Labs and Imaging:   BMET    Component Value Date/Time   NA 139 01/04/2011 0520   K 3.9 01/04/2011 0520   CL 102 01/04/2011 0520   CO2 28 01/04/2011 0520   GLUCOSE 137* 01/04/2011 0520   BUN 14 01/04/2011 0520   CREATININE 0.79 01/04/2011 0520   CALCIUM 8.3* 01/04/2011 0520   GFRNONAA >90 01/04/2011 0520   GFRAA >90 01/04/2011 0520    CBC    Component Value Date/Time   WBC 9.2 01/04/2011 0520   RBC 3.31* 01/04/2011 0520   HGB 7.7* 01/04/2011 0520   HCT 26.1* 01/04/2011 0520   PLT 283 01/04/2011 0520   MCV 78.9 01/04/2011 0520   MCH 23.3* 01/04/2011 0520   MCHC 29.5* 01/04/2011 0520   RDW 17.4* 01/04/2011 0520   LYMPHSABS 0.9 01/04/2011 0520   MONOABS 0.8 01/04/2011 0520   EOSABS 0.0 01/04/2011 0520   BASOSABS 0.0 01/04/2011 0520      Assessment and Plan:  Encephalopathy (metabolic, hypercarbic, medications induced).  Now awake, alert, following commands. Likely hypercarbic. Resolved  Acute on chronic hypercarbic respiratory failure, likely multifactorial (asthma, OSA/OHS, cervical myelopathy).  ).  No evidence of acute bronchospasm. Resolved  Recent pulmonary embolism on Rivaroxoban Cont same  Cervical myelopathy s/p C6-C7 decompression -->consult Dr Alveda Reasons for F/U and direction re: need for C-collar  History of congestive heart failure, hypertension.  No evidence of acute exacerbation.  Normotensive now. -->Resume home meds (Carvedilol, Lisinopril, Amlodipine, Spironolactone) as tolerated  History of diabetes, risk of hyperglycemia while on steroids -->Cont SSI  Best practices / Disposition Transfer to reg bed Advance activity Anticipate D/C home in next day or two  Billy Fischer, MD;  PCCM service; Mobile [phone]

## 2011-01-05 NOTE — Progress Notes (Signed)
Report called to Lauren RN on 5000. Pt being transferred to [phone]

## 2011-01-05 NOTE — Progress Notes (Signed)
Seen today in room 5033.  He was seen in my office about 3 weeks ago.  Insofar as his neck is concerned he has no complaints.  His ASPEN collar, in which he says he arrived, has apparently been lost since his admission.  I had, according to what he tells me , told him that he could discontinue use of the ASPEN collar one month after his Dec 3 visit.  I think that he can carry on without it, if it has been lost.  He will follow-up for his regularly scheduled appointment in the  Office, unless he develops some specific problem with his neck.

## 2011-01-06 DIAGNOSIS — E119 Type 2 diabetes mellitus without complications: Secondary | ICD-10-CM

## 2011-01-06 DIAGNOSIS — G9340 Encephalopathy, unspecified: Secondary | ICD-10-CM

## 2011-01-06 DIAGNOSIS — I2699 Other pulmonary embolism without acute cor pulmonale: Secondary | ICD-10-CM

## 2011-01-06 DIAGNOSIS — J9600 Acute respiratory failure, unspecified whether with hypoxia or hypercapnia: Secondary | ICD-10-CM

## 2011-01-06 LAB — GLUCOSE, CAPILLARY

## 2011-01-06 MED ORDER — MONTELUKAST SODIUM 10 MG PO TABS: 10.0000 mg | ORAL_TABLET | Freq: Every day | ORAL | Status: AC

## 2011-01-06 MED ORDER — ESCITALOPRAM OXALATE 20 MG PO TABS: 20.0000 mg | ORAL_TABLET | Freq: Every day | ORAL | Status: DC

## 2011-01-06 NOTE — Discharge Summary (Signed)
Physician Discharge Summary  Patient ID: Cody Cordova MRN: [id number] DOB/AGE: March 26, 1970 40 y.o.  Admit date: 01/04/2011 Discharge date: 01/06/2011   Discharge Diagnosis Acute respiratory failure secondary to hypercarbic failure Cervical myelopathy s/p surgical intervention Hyperglycemia  Pulmonary embolism HTN (hypertension) Anemia associated with acute blood loss MRSA (methicillin resistant Staphylococcus aureus)- Hx of   Brief Summary: Cody Cordova is a 40 y.o. y/o male with a PMH of  40 yo AAM with recent neck surgery for cervical myelopathy complicated by pulmonary embolism (reportedly resistant to Coumadin, recently started on Rivaroxoban), OSA (? OHS) on CPAP (?BiPAP) and congestive heart failure brought to [facility] in AM 12/24 with respiratory distress. Intubated for reported hypercarbic respiratory failure (pH 7.33, pCO2 65, pO2 275).  Reportedly difficult intubation requiring bougie, also some bleeding from EMS attempting nasal airway. Upon arrival to [facility] is off sedation, breathing spontaneously, following commands.  Patient was extubated to 4L /[place] on 12/25 without further difficulties.  He was continued on nocturnal CPAP (although he indicates recent non-compliance with regimen at home).  He made quick recovery.  He apparently received an isolated dose of clindamycin at [facility].  No further antibiotics at [facility].  He has known history of PE on Rivaroxoban which was continued during hospitalization and will be post discharge.  He will return to previous home medication regimen.  Home RN evaluation will be ordered out of concern for patients understanding of medications and which to take on daily basis.  As well as safety evaluation.   Noted mild elevation of CBG's during hospitalization related to stress response.  Recommend follow up regarding glucose by PCP.    Lines / Drains:  12/24 ETT (difficult intubation) -->12/25 12/24 Foley   Cultures:  None     Antibiotics:  12/24 Clindamycin x1   Tests / Events:  12/25 Transferred from [place] where he was intubated for hypercarbic respiratory failure 12/25 CXR>>>Endotracheal tube seen ending 5 cm above the carina.  Vascular congestion and cardiomegaly, with diffusely increased interstitial markings, raising question for mild pulmonary edema.    Filed Vitals:   01/05/11 1945 01/05/11 2115 01/06/11 0553 01/06/11 1329  BP:  153/87 144/86 146/81  Pulse:  87 77 92  Temp:  98.7 F (37.1 C) 98.7 F (37.1 C) 98.4 F (36.9 C)  TempSrc:      Resp:  20 20 20   Height:      Weight:      SpO2: 99% 93% 95% 98%    Discharge Exam: General: Morbidly obese, not in acute distress  Neuro: Awake, alert, oriented HEENT: PERRL  Neck: No JVD noted  Cardiovascular: RRR, no M/R/G  Lungs: Resp's even / non-labored, lungs bilaterally diminished but clear Abdomen: Obese, bowel sounds present  Musculoskeletal: 2+ bilateral lower extremities edema  Skin: Multiple skin lesions resembling healed skin abscesses and multiple healed surgical scars    Discharge Labs BMET    Component Value Date/Time   NA 139 01/04/2011 0520   K 3.9 01/04/2011 0520   CL 102 01/04/2011 0520   CO2 28 01/04/2011 0520   GLUCOSE 137* 01/04/2011 0520   BUN 14 01/04/2011 0520   CREATININE 0.79 01/04/2011 0520   CALCIUM 8.3* 01/04/2011 0520   GFRNONAA >90 01/04/2011 0520   GFRAA >90 01/04/2011 0520    CBC    Component Value Date/Time   WBC 9.2 01/04/2011 0520   RBC 3.31* 01/04/2011 0520   HGB 7.7* 01/04/2011 0520   HCT 26.1* 01/04/2011 0520  PLT 283 01/04/2011 0520   MCV 78.9 01/04/2011 0520   MCH 23.3* 01/04/2011 0520   MCHC 29.5* 01/04/2011 0520   RDW 17.4* 01/04/2011 0520   LYMPHSABS 0.9 01/04/2011 0520   MONOABS 0.8 01/04/2011 0520   EOSABS 0.0 01/04/2011 0520   BASOSABS 0.0 01/04/2011 0520        Discharge Orders    Future Appointments: Provider: Department: Dept Phone: Center:   02/09/2011  12:00 PM Ranelle Oyster, MD [facility] [phone] CPR     Future Orders Please Complete By Expires   Ambulatory referral to Home Health      Comments:   Please evaluate ORVIS STANN for admission to [facility].  Disciplines requested: Nursing  Services to provide: Evaluate  Physician to follow patient's care (the person listed here will be responsible for signing ongoing orders): Referring Provider  Requested Start of Care Date: Tomorrow  Special Instructions:  Needs assistance with home medication regimen.   Diet Carb Modified      Increase activity slowly      Nursing communication      Comments:   Review discharge instructions with patient.       Stelios, Kirby  Home Medication Instructions [id number]   Printed on:01/06/11 1416  Medication Information                    hydrochlorothiazide (HYDRODIURIL) 25 MG tablet Take 25 mg by mouth daily.             rivaroxaban (XARELTO) 10 MG TABS tablet Take 20 mg by mouth daily.             atorvastatin (LIPITOR) 20 MG tablet Take 20 mg by mouth daily.             Fluticasone-Salmeterol (ADVAIR) 250-50 MCG/DOSE AEPB Inhale 1 puff into the lungs every 12 (twelve) hours.             tiotropium (SPIRIVA) 18 MCG inhalation capsule Place 18 mcg into inhaler and inhale daily.             celecoxib (CELEBREX) 200 MG capsule Take 200 mg by mouth 2 (two) times daily.             potassium chloride SA (K-DUR,KLOR-CON) 20 MEQ tablet Take 20 mEq by mouth 3 (three) times daily.             eszopiclone (LUNESTA) 2 MG TABS Take 2 mg by mouth at bedtime. Take immediately before bedtime            amLODipine (NORVASC) 5 MG tablet Take 5 mg by mouth daily.             ARIPiprazole (ABILIFY) 10 MG tablet Take 10 mg by mouth daily.             esomeprazole (NEXIUM) 40 MG capsule Take 40 mg by mouth daily before breakfast.             carvedilol (COREG) 12.5 MG tablet Take 12.5 mg by mouth daily.               ketoconazole (NIZORAL) 2 % shampoo Apply 1 application topically 2 (two) times a week.             mupirocin ointment (BACTROBAN) 2 % Apply 1 application topically 2 (two) times daily.             clobetasol cream (TEMOVATE) 0.05 % Apply 1 application  topically 2 (two) times daily.             tacrolimus (PROTOPIC) 0.1 % ointment Apply 1 application topically 2 (two) times daily.             calcipotriene-betamethasone (TACLONEX) ointment Apply 1 application topically daily.             escitalopram (LEXAPRO) 20 MG tablet Take 1 tablet (20 mg total) by mouth daily.           montelukast (SINGULAIR) 10 MG tablet Take 1 tablet (10 mg total) by mouth at bedtime.              Follow-up Information    Follow up with Rodena Piety, NP on 01/13/2011. (at 345 pm)    Contact information:   [phone]         Disposition: Home-Health Care Svc Discharged Condition: SIDDHANTH DENK has met maximum benefit of inpatient care and is medically stable and cleared for discharge.  Patient is pending follow up as above.   [facility] called at time of discharge as patient indicates he is on Opana 30mg  XR BID.  Concerned as he has not been on during hospital admit.  Clinic verifies that he HAS NOT been received a script since 09/2010.  No scripts given for narcotics out of concern for respiratory failure with narcotics and underlying OSA.   Time spent on disposition:  Greater than 35 minutes.   Signed: Canary Brim, NP-C [place] [facility] Pgr: [phone]    Patient seen and examined, agree with above note.  I dictated the care and orders written for this patient under my direction.  Koren Bound, M.D.

## 2011-01-06 NOTE — Progress Notes (Signed)
   CARE MANAGEMENT NOTE 01/06/2011  Patient:  Cody Cordova, Cody Cordova   Account Number:  [id number]  Date Initiated:  01/05/2011  Documentation initiated by:  [facility]  Subjective/Objective Assessment:   Resp failure - tx from outside hospital - intubated.     Action/Plan:   Anticipated DC Date:  01/10/2011   Anticipated DC Plan:  HOME W HOME HEALTH SERVICES      DC Planning Services  CM consult      [facility] Choice  HOME HEALTH   Choice offered to / List presented to:  C-1 Patient        HH arranged  HH-1 RN  HH-4 NURSE'S AIDE      HH agency  OTHER - SEE NOTE   Status of service:  Completed, signed off Medicare Important Message given?   (If response is "NO", the following Medicare IM given date fields will be blank) Date Medicare IM given:   Date Additional Medicare IM given:    Discharge Disposition:  HOME W HOME HEALTH SERVICES  Per UR Regulation:  Reviewed for med. necessity/level of care/duration of stay  Comments:  01/06/11 August Gosser,RN,BSN 1530 MET WITH PT TO DISCUSS DC PLANS.  PT WILL DC HOME WITH MOTHER.  PTA, WAS RECEIVING HOME HEALTH THROUGH [facility] [place].  ORDERS FOR RESUMPTION OF SERVICES RECEIVED FROM NP.  FAXED REFERRAL TO HH AGENCY. START OF CARE 24-48H POST DC DATE. Phone #[phone]   01-05-11 3:10pm Avie Arenas, RNBSN - [phone] UR completed.

## 2011-01-06 NOTE — Progress Notes (Signed)
Name: ARSHAD OBERHOLZER MRN: [id number] DOB: 05-21-1970    LOS: 2  PCCM PROGRESS NOTE  PT PROFILE: 40 yo AAM with recent neck surgery for cervical myelopathy complicated by pulmonary embolism (reportedly resistant to Coumadin, recently started on Rivaroxoban), OSA (? OHS) on CPAP (?BiPAP) and congestive heart failure brought to [facility] in AM 12/24 wit respiratory distress.  Intubated for hypercarbic respiratory failure (pH 7.33, pCO2 65, pO2 275).  Reportedly difficult intubation requiring bougie, also some bleeding from EMS attempting nasal airway.  Upon arrival to [facility] is off sedation, breathing spontaneously, following commands.  Lines / Drains: 12/24  ETT (difficult intubation) >> 12/25 12/24  Foley >> 12/25  Cultures: None  Antibiotics: 12/24  Clindamycin x1   Vital Signs: Temp:  [98.7 F (37.1 C)-99.2 F (37.3 C)] 98.7 F (37.1 C) (12/27 0553) Pulse Rate:  [77-90] 77  (12/27 0553) Resp:  [20] 20  (12/27 0553) BP: (144-159)/(86-87) 144/86 mmHg (12/27 0553) SpO2:  [93 %-99 %] 95 % (12/27 0553) FiO2 (%):  [0 %] 0 % (12/26 1945) I/O last 3 completed shifts: In: 1690 [P.O.:390; I.V.:1300] Out: 1050 [Urine:1050]  Physical Examination: General:  Morbidly obese, not in acute distress Neuro:  Awake, alert, nonverbal, follows commands   HEENT:  PERRL, ETT Neck:  No JVD noted   Cardiovascular:  RRR, no M/R/G Lungs:  Clrear anteriorly Abdomen:  Obese, bowel sounds present Musculoskeletal:  2+ bilateral lower extremities edema  Skin:  Multiple skin lesions resembling healed skin abscesses and multiple healed surgical scars  Labs and Imaging:   BMET    Component Value Date/Time   NA 139 01/04/2011 0520   K 3.9 01/04/2011 0520   CL 102 01/04/2011 0520   CO2 28 01/04/2011 0520   GLUCOSE 137* 01/04/2011 0520   BUN 14 01/04/2011 0520   CREATININE 0.79 01/04/2011 0520   CALCIUM 8.3* 01/04/2011 0520   GFRNONAA >90 01/04/2011 0520   GFRAA >90 01/04/2011 0520    CBC    Component Value Date/Time   WBC 9.2 01/04/2011 0520   RBC 3.31* 01/04/2011 0520   HGB 7.7* 01/04/2011 0520   HCT 26.1* 01/04/2011 0520   PLT 283 01/04/2011 0520   MCV 78.9 01/04/2011 0520   MCH 23.3* 01/04/2011 0520   MCHC 29.5* 01/04/2011 0520   RDW 17.4* 01/04/2011 0520   LYMPHSABS 0.9 01/04/2011 0520   MONOABS 0.8 01/04/2011 0520   EOSABS 0.0 01/04/2011 0520   BASOSABS 0.0 01/04/2011 0520   Assessment and Plan:  Encephalopathy (metabolic, hypercarbic, medications induced).  Now awake, alert, following commands. Likely hypercarbic. Resolved  Acute on chronic hypercarbic respiratory failure, likely multifactorial (asthma, OSA/OHS, cervical myelopathy).  ).  No evidence of acute bronchospasm. Resolved  Recent pulmonary embolism on Rivaroxoban Cont same  Cervical myelopathy s/p C6-C7 decompression - Consult from Dr Alveda Reasons noted, he recommends only f/u with him in the office.  History of congestive heart failure, hypertension.  No evidence of acute exacerbation.  Normotensive now. -->Resume home meds (Carvedilol, Lisinopril, Amlodipine, Spironolactone) as tolerated  History of diabetes, risk of hyperglycemia while on steroids -->Cont SSI  Best practices / Disposition Transfer to reg bed. Advance activity. Anticipate D/C home in next day or two.  Billy Fischer, MD;  PCCM service.

## 2011-02-09 ENCOUNTER — Encounter
Payer: PRIVATE HEALTH INSURANCE | Attending: Physical Medicine & Rehabilitation | Admitting: Physical Medicine & Rehabilitation

## 2011-07-12 DIAGNOSIS — L409 Psoriasis, unspecified: Secondary | ICD-10-CM | POA: Insufficient documentation

## 2011-07-12 DIAGNOSIS — L819 Disorder of pigmentation, unspecified: Secondary | ICD-10-CM | POA: Insufficient documentation

## 2011-11-29 ENCOUNTER — Encounter: Payer: PRIVATE HEALTH INSURANCE | Admitting: Internal Medicine

## 2011-11-29 DIAGNOSIS — D696 Thrombocytopenia, unspecified: Secondary | ICD-10-CM

## 2011-12-16 DIAGNOSIS — I509 Heart failure, unspecified: Secondary | ICD-10-CM | POA: Insufficient documentation

## 2011-12-16 DIAGNOSIS — G4733 Obstructive sleep apnea (adult) (pediatric): Secondary | ICD-10-CM | POA: Insufficient documentation

## 2011-12-16 DIAGNOSIS — J90 Pleural effusion, not elsewhere classified: Secondary | ICD-10-CM | POA: Insufficient documentation

## 2011-12-16 DIAGNOSIS — Z8639 Personal history of other endocrine, nutritional and metabolic disease: Secondary | ICD-10-CM | POA: Insufficient documentation

## 2011-12-16 DIAGNOSIS — D696 Thrombocytopenia, unspecified: Secondary | ICD-10-CM | POA: Insufficient documentation

## 2011-12-16 DIAGNOSIS — I519 Heart disease, unspecified: Secondary | ICD-10-CM | POA: Insufficient documentation

## 2012-01-27 DIAGNOSIS — D693 Immune thrombocytopenic purpura: Secondary | ICD-10-CM

## 2012-04-12 ENCOUNTER — Other Ambulatory Visit: Payer: Self-pay | Admitting: Orthopedic Surgery

## 2012-04-12 DIAGNOSIS — M549 Dorsalgia, unspecified: Secondary | ICD-10-CM

## 2012-04-18 ENCOUNTER — Ambulatory Visit
Admission: RE | Admit: 2012-04-18 | Discharge: 2012-04-18 | Disposition: A | Discharge status: Home / Self Care | Payer: PRIVATE HEALTH INSURANCE | Source: Ambulatory Visit | Attending: Orthopedic Surgery | Admitting: Orthopedic Surgery

## 2012-04-18 ENCOUNTER — Other Ambulatory Visit: Payer: Self-pay | Admitting: Internal Medicine

## 2012-04-18 ENCOUNTER — Other Ambulatory Visit: Payer: Self-pay | Admitting: Orthopedic Surgery

## 2012-04-18 VITALS — BP 178/81 | HR 87

## 2012-04-18 DIAGNOSIS — M549 Dorsalgia, unspecified: Secondary | ICD-10-CM

## 2012-04-18 MED ORDER — DIAZEPAM 5 MG PO TABS
10.0000 mg | ORAL_TABLET | Freq: Once | ORAL | Status: AC
  Administered 2012-04-18: 10 mg via ORAL

## 2012-04-18 MED ORDER — IOHEXOL 300 MG/ML  SOLN
10.0000 mL | Freq: Once | INTRAMUSCULAR | Status: AC | PRN
  Administered 2012-04-18: 10 mL via INTRATHECAL

## 2012-04-18 NOTE — Progress Notes (Signed)
Pt states he has been off lexapro and trazodone for the past 2 days.

## 2012-04-23 ENCOUNTER — Other Ambulatory Visit: Payer: Self-pay | Admitting: Orthopedic Surgery

## 2012-04-24 ENCOUNTER — Encounter: Payer: PRIVATE HEALTH INSURANCE | Admitting: Internal Medicine

## 2012-04-24 ENCOUNTER — Ambulatory Visit
Admission: RE | Admit: 2012-04-24 | Discharge: 2012-04-24 | Disposition: A | Discharge status: Home / Self Care | Payer: PRIVATE HEALTH INSURANCE | Source: Ambulatory Visit | Attending: Orthopedic Surgery | Admitting: Orthopedic Surgery

## 2012-04-24 VITALS — BP 179/88 | HR 75

## 2012-04-24 DIAGNOSIS — G971 Other reaction to spinal and lumbar puncture: Secondary | ICD-10-CM

## 2012-04-24 MED ORDER — IOHEXOL 180 MG/ML  SOLN
1.0000 mL | Freq: Once | INTRAMUSCULAR | Status: AC | PRN
  Administered 2012-04-24: 1 mL via EPIDURAL

## 2012-04-24 NOTE — Progress Notes (Signed)
Dr. Carlota Raspberry in to speak with patient.  Procedure explained, questions answered.  20cc blood drawn for procedure from left AC space; site unremarkable.  jkl

## 2012-06-20 ENCOUNTER — Encounter: Payer: PRIVATE HEALTH INSURANCE | Admitting: Internal Medicine

## 2012-06-20 DIAGNOSIS — D693 Immune thrombocytopenic purpura: Secondary | ICD-10-CM

## 2012-08-27 DIAGNOSIS — G894 Chronic pain syndrome: Secondary | ICD-10-CM | POA: Insufficient documentation

## 2012-08-27 DIAGNOSIS — M545 Low back pain, unspecified: Secondary | ICD-10-CM | POA: Insufficient documentation

## 2012-08-27 DIAGNOSIS — M47816 Spondylosis without myelopathy or radiculopathy, lumbar region: Secondary | ICD-10-CM | POA: Insufficient documentation

## 2012-09-28 ENCOUNTER — Ambulatory Visit: Payer: PRIVATE HEALTH INSURANCE | Admitting: Cardiovascular Disease

## 2012-09-30 ENCOUNTER — Encounter (HOSPITAL_COMMUNITY): Payer: Self-pay | Admitting: *Deleted

## 2012-09-30 ENCOUNTER — Emergency Department (HOSPITAL_COMMUNITY): Payer: PRIVATE HEALTH INSURANCE

## 2012-09-30 ENCOUNTER — Emergency Department (HOSPITAL_COMMUNITY)
Admission: EM | Admit: 2012-09-30 | Discharge: 2012-09-30 | Disposition: A | Discharge status: Home / Self Care | Payer: PRIVATE HEALTH INSURANCE | Attending: Emergency Medicine | Admitting: Emergency Medicine

## 2012-09-30 DIAGNOSIS — F329 Major depressive disorder, single episode, unspecified: Secondary | ICD-10-CM | POA: Insufficient documentation

## 2012-09-30 DIAGNOSIS — S199XXA Unspecified injury of neck, initial encounter: Secondary | ICD-10-CM

## 2012-09-30 DIAGNOSIS — Y929 Unspecified place or not applicable: Secondary | ICD-10-CM | POA: Insufficient documentation

## 2012-09-30 DIAGNOSIS — I2510 Atherosclerotic heart disease of native coronary artery without angina pectoris: Secondary | ICD-10-CM | POA: Insufficient documentation

## 2012-09-30 DIAGNOSIS — F3289 Other specified depressive episodes: Secondary | ICD-10-CM | POA: Insufficient documentation

## 2012-09-30 DIAGNOSIS — G8929 Other chronic pain: Secondary | ICD-10-CM | POA: Insufficient documentation

## 2012-09-30 DIAGNOSIS — J4489 Other specified chronic obstructive pulmonary disease: Secondary | ICD-10-CM | POA: Insufficient documentation

## 2012-09-30 DIAGNOSIS — Z7901 Long term (current) use of anticoagulants: Secondary | ICD-10-CM | POA: Insufficient documentation

## 2012-09-30 DIAGNOSIS — E119 Type 2 diabetes mellitus without complications: Secondary | ICD-10-CM | POA: Insufficient documentation

## 2012-09-30 DIAGNOSIS — W19XXXA Unspecified fall, initial encounter: Secondary | ICD-10-CM | POA: Insufficient documentation

## 2012-09-30 DIAGNOSIS — Z791 Long term (current) use of non-steroidal anti-inflammatories (NSAID): Secondary | ICD-10-CM | POA: Insufficient documentation

## 2012-09-30 DIAGNOSIS — Z8614 Personal history of Methicillin resistant Staphylococcus aureus infection: Secondary | ICD-10-CM | POA: Insufficient documentation

## 2012-09-30 DIAGNOSIS — I10 Essential (primary) hypertension: Secondary | ICD-10-CM | POA: Insufficient documentation

## 2012-09-30 DIAGNOSIS — Y939 Activity, unspecified: Secondary | ICD-10-CM | POA: Insufficient documentation

## 2012-09-30 DIAGNOSIS — Z792 Long term (current) use of antibiotics: Secondary | ICD-10-CM | POA: Insufficient documentation

## 2012-09-30 DIAGNOSIS — Z9889 Other specified postprocedural states: Secondary | ICD-10-CM | POA: Insufficient documentation

## 2012-09-30 DIAGNOSIS — Z79899 Other long term (current) drug therapy: Secondary | ICD-10-CM | POA: Insufficient documentation

## 2012-09-30 DIAGNOSIS — E669 Obesity, unspecified: Secondary | ICD-10-CM | POA: Insufficient documentation

## 2012-09-30 DIAGNOSIS — Z86711 Personal history of pulmonary embolism: Secondary | ICD-10-CM | POA: Insufficient documentation

## 2012-09-30 DIAGNOSIS — I509 Heart failure, unspecified: Secondary | ICD-10-CM | POA: Insufficient documentation

## 2012-09-30 DIAGNOSIS — IMO0002 Reserved for concepts with insufficient information to code with codable children: Secondary | ICD-10-CM | POA: Insufficient documentation

## 2012-09-30 DIAGNOSIS — K219 Gastro-esophageal reflux disease without esophagitis: Secondary | ICD-10-CM | POA: Insufficient documentation

## 2012-09-30 DIAGNOSIS — J449 Chronic obstructive pulmonary disease, unspecified: Secondary | ICD-10-CM | POA: Insufficient documentation

## 2012-09-30 DIAGNOSIS — Z8739 Personal history of other diseases of the musculoskeletal system and connective tissue: Secondary | ICD-10-CM | POA: Insufficient documentation

## 2012-09-30 DIAGNOSIS — S0993XA Unspecified injury of face, initial encounter: Secondary | ICD-10-CM | POA: Insufficient documentation

## 2012-09-30 DIAGNOSIS — Z862 Personal history of diseases of the blood and blood-forming organs and certain disorders involving the immune mechanism: Secondary | ICD-10-CM | POA: Insufficient documentation

## 2012-09-30 DIAGNOSIS — G4730 Sleep apnea, unspecified: Secondary | ICD-10-CM | POA: Insufficient documentation

## 2012-09-30 MED ORDER — KETOROLAC TROMETHAMINE 60 MG/2ML IM SOLN
60.0000 mg | Freq: Once | INTRAMUSCULAR | Status: AC
  Administered 2012-09-30: 60 mg via INTRAMUSCULAR
  Filled 2012-09-30: qty 2

## 2012-09-30 NOTE — ED Notes (Signed)
The pt fell one week ago .  He has had neck back and leg pain since.  He has been seen at the pain clinic.  He has chronic back pain from back surgeries
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
## 2012-10-03 NOTE — ED Provider Notes (Signed)
Medical screening examination/treatment/procedure(s) were performed by non-physician practitioner and as supervising physician I was immediately available for consultation/collaboration.    Christopher J. Pollina, MD 10/03/12 0432 

## 2012-10-22 ENCOUNTER — Ambulatory Visit: Payer: PRIVATE HEALTH INSURANCE | Admitting: Cardiovascular Disease

## 2012-10-22 ENCOUNTER — Encounter: Payer: Self-pay | Admitting: Cardiovascular Disease

## 2012-12-19 DIAGNOSIS — G549 Nerve root and plexus disorder, unspecified: Secondary | ICD-10-CM | POA: Insufficient documentation

## 2012-12-19 DIAGNOSIS — M542 Cervicalgia: Secondary | ICD-10-CM | POA: Insufficient documentation

## 2012-12-19 DIAGNOSIS — M5030 Other cervical disc degeneration, unspecified cervical region: Secondary | ICD-10-CM | POA: Insufficient documentation

## 2012-12-28 ENCOUNTER — Emergency Department (HOSPITAL_COMMUNITY)
Admission: EM | Admit: 2012-12-28 | Discharge: 2012-12-28 | Disposition: A | Discharge status: Home / Self Care | Payer: No Typology Code available for payment source | Attending: Emergency Medicine | Admitting: Emergency Medicine

## 2012-12-28 ENCOUNTER — Emergency Department (HOSPITAL_COMMUNITY): Payer: No Typology Code available for payment source

## 2012-12-28 ENCOUNTER — Encounter (HOSPITAL_COMMUNITY): Payer: Self-pay | Admitting: Emergency Medicine

## 2012-12-28 DIAGNOSIS — E669 Obesity, unspecified: Secondary | ICD-10-CM | POA: Insufficient documentation

## 2012-12-28 DIAGNOSIS — Z862 Personal history of diseases of the blood and blood-forming organs and certain disorders involving the immune mechanism: Secondary | ICD-10-CM | POA: Insufficient documentation

## 2012-12-28 DIAGNOSIS — S139XXA Sprain of joints and ligaments of unspecified parts of neck, initial encounter: Secondary | ICD-10-CM | POA: Insufficient documentation

## 2012-12-28 DIAGNOSIS — Z86711 Personal history of pulmonary embolism: Secondary | ICD-10-CM | POA: Insufficient documentation

## 2012-12-28 DIAGNOSIS — Z791 Long term (current) use of non-steroidal anti-inflammatories (NSAID): Secondary | ICD-10-CM | POA: Insufficient documentation

## 2012-12-28 DIAGNOSIS — Z8739 Personal history of other diseases of the musculoskeletal system and connective tissue: Secondary | ICD-10-CM | POA: Insufficient documentation

## 2012-12-28 DIAGNOSIS — G4730 Sleep apnea, unspecified: Secondary | ICD-10-CM | POA: Insufficient documentation

## 2012-12-28 DIAGNOSIS — Y92410 Unspecified street and highway as the place of occurrence of the external cause: Secondary | ICD-10-CM | POA: Insufficient documentation

## 2012-12-28 DIAGNOSIS — I10 Essential (primary) hypertension: Secondary | ICD-10-CM | POA: Insufficient documentation

## 2012-12-28 DIAGNOSIS — F3289 Other specified depressive episodes: Secondary | ICD-10-CM | POA: Insufficient documentation

## 2012-12-28 DIAGNOSIS — K219 Gastro-esophageal reflux disease without esophagitis: Secondary | ICD-10-CM | POA: Insufficient documentation

## 2012-12-28 DIAGNOSIS — Z794 Long term (current) use of insulin: Secondary | ICD-10-CM | POA: Insufficient documentation

## 2012-12-28 DIAGNOSIS — Z79899 Other long term (current) drug therapy: Secondary | ICD-10-CM | POA: Insufficient documentation

## 2012-12-28 DIAGNOSIS — Z7982 Long term (current) use of aspirin: Secondary | ICD-10-CM | POA: Insufficient documentation

## 2012-12-28 DIAGNOSIS — J4489 Other specified chronic obstructive pulmonary disease: Secondary | ICD-10-CM | POA: Insufficient documentation

## 2012-12-28 DIAGNOSIS — S161XXA Strain of muscle, fascia and tendon at neck level, initial encounter: Secondary | ICD-10-CM

## 2012-12-28 DIAGNOSIS — S4980XA Other specified injuries of shoulder and upper arm, unspecified arm, initial encounter: Secondary | ICD-10-CM | POA: Insufficient documentation

## 2012-12-28 DIAGNOSIS — F329 Major depressive disorder, single episode, unspecified: Secondary | ICD-10-CM | POA: Insufficient documentation

## 2012-12-28 DIAGNOSIS — J449 Chronic obstructive pulmonary disease, unspecified: Secondary | ICD-10-CM | POA: Insufficient documentation

## 2012-12-28 DIAGNOSIS — I509 Heart failure, unspecified: Secondary | ICD-10-CM | POA: Insufficient documentation

## 2012-12-28 DIAGNOSIS — I2510 Atherosclerotic heart disease of native coronary artery without angina pectoris: Secondary | ICD-10-CM | POA: Insufficient documentation

## 2012-12-28 DIAGNOSIS — Z8614 Personal history of Methicillin resistant Staphylococcus aureus infection: Secondary | ICD-10-CM | POA: Insufficient documentation

## 2012-12-28 DIAGNOSIS — S46909A Unspecified injury of unspecified muscle, fascia and tendon at shoulder and upper arm level, unspecified arm, initial encounter: Secondary | ICD-10-CM | POA: Insufficient documentation

## 2012-12-28 DIAGNOSIS — E119 Type 2 diabetes mellitus without complications: Secondary | ICD-10-CM | POA: Insufficient documentation

## 2012-12-28 DIAGNOSIS — Z9889 Other specified postprocedural states: Secondary | ICD-10-CM | POA: Insufficient documentation

## 2012-12-28 DIAGNOSIS — Y9389 Activity, other specified: Secondary | ICD-10-CM | POA: Insufficient documentation

## 2012-12-28 MED ORDER — METHOCARBAMOL 500 MG PO TABS: 500.0000 mg | ORAL_TABLET | Freq: Three times a day (TID) | ORAL | Status: DC

## 2012-12-28 MED ORDER — DICLOFENAC SODIUM 75 MG PO TBEC: 75.0000 mg | DELAYED_RELEASE_TABLET | Freq: Two times a day (BID) | ORAL | Status: DC
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
## 2012-12-29 NOTE — ED Provider Notes (Signed)
Medical screening examination/treatment/procedure(s) were performed by non-physician practitioner and as supervising physician I was immediately available for consultation/collaboration.  EKG Interpretation   None       Alverta Caccamo, MD, FACEP   Tyronza Happe L Winnie Umali, MD 12/29/12 0134 

## 2012-12-31 ENCOUNTER — Emergency Department (HOSPITAL_COMMUNITY): Payer: No Typology Code available for payment source

## 2012-12-31 ENCOUNTER — Encounter (HOSPITAL_COMMUNITY): Payer: Self-pay | Admitting: Emergency Medicine

## 2012-12-31 ENCOUNTER — Emergency Department (HOSPITAL_COMMUNITY)
Admission: EM | Admit: 2012-12-31 | Discharge: 2012-12-31 | Disposition: A | Discharge status: Home / Self Care | Payer: No Typology Code available for payment source | Attending: Emergency Medicine | Admitting: Emergency Medicine

## 2012-12-31 DIAGNOSIS — M542 Cervicalgia: Secondary | ICD-10-CM | POA: Insufficient documentation

## 2012-12-31 DIAGNOSIS — Z8614 Personal history of Methicillin resistant Staphylococcus aureus infection: Secondary | ICD-10-CM | POA: Insufficient documentation

## 2012-12-31 DIAGNOSIS — Z791 Long term (current) use of non-steroidal anti-inflammatories (NSAID): Secondary | ICD-10-CM | POA: Insufficient documentation

## 2012-12-31 DIAGNOSIS — I10 Essential (primary) hypertension: Secondary | ICD-10-CM | POA: Insufficient documentation

## 2012-12-31 DIAGNOSIS — M549 Dorsalgia, unspecified: Secondary | ICD-10-CM | POA: Insufficient documentation

## 2012-12-31 DIAGNOSIS — J4489 Other specified chronic obstructive pulmonary disease: Secondary | ICD-10-CM | POA: Insufficient documentation

## 2012-12-31 DIAGNOSIS — J449 Chronic obstructive pulmonary disease, unspecified: Secondary | ICD-10-CM | POA: Insufficient documentation

## 2012-12-31 DIAGNOSIS — E119 Type 2 diabetes mellitus without complications: Secondary | ICD-10-CM | POA: Insufficient documentation

## 2012-12-31 DIAGNOSIS — G4730 Sleep apnea, unspecified: Secondary | ICD-10-CM | POA: Insufficient documentation

## 2012-12-31 DIAGNOSIS — Z86711 Personal history of pulmonary embolism: Secondary | ICD-10-CM | POA: Insufficient documentation

## 2012-12-31 DIAGNOSIS — Z7982 Long term (current) use of aspirin: Secondary | ICD-10-CM | POA: Insufficient documentation

## 2012-12-31 DIAGNOSIS — F329 Major depressive disorder, single episode, unspecified: Secondary | ICD-10-CM | POA: Insufficient documentation

## 2012-12-31 DIAGNOSIS — S161XXS Strain of muscle, fascia and tendon at neck level, sequela: Secondary | ICD-10-CM

## 2012-12-31 DIAGNOSIS — Z789 Other specified health status: Secondary | ICD-10-CM | POA: Insufficient documentation

## 2012-12-31 DIAGNOSIS — F3289 Other specified depressive episodes: Secondary | ICD-10-CM | POA: Insufficient documentation

## 2012-12-31 DIAGNOSIS — I2510 Atherosclerotic heart disease of native coronary artery without angina pectoris: Secondary | ICD-10-CM | POA: Insufficient documentation

## 2012-12-31 DIAGNOSIS — Z794 Long term (current) use of insulin: Secondary | ICD-10-CM | POA: Insufficient documentation

## 2012-12-31 DIAGNOSIS — Z79899 Other long term (current) drug therapy: Secondary | ICD-10-CM | POA: Insufficient documentation

## 2012-12-31 DIAGNOSIS — G8911 Acute pain due to trauma: Secondary | ICD-10-CM | POA: Insufficient documentation

## 2012-12-31 DIAGNOSIS — I509 Heart failure, unspecified: Secondary | ICD-10-CM | POA: Insufficient documentation

## 2012-12-31 DIAGNOSIS — Z862 Personal history of diseases of the blood and blood-forming organs and certain disorders involving the immune mechanism: Secondary | ICD-10-CM | POA: Insufficient documentation

## 2012-12-31 DIAGNOSIS — E669 Obesity, unspecified: Secondary | ICD-10-CM | POA: Insufficient documentation

## 2012-12-31 DIAGNOSIS — K219 Gastro-esophageal reflux disease without esophagitis: Secondary | ICD-10-CM | POA: Insufficient documentation

## 2012-12-31 MED ORDER — HYDROMORPHONE HCL PF 1 MG/ML IJ SOLN
1.0000 mg | Freq: Once | INTRAMUSCULAR | Status: AC
  Administered 2012-12-31: 1 mg via INTRAMUSCULAR
  Filled 2012-12-31: qty 1

## 2012-12-31 NOTE — ED Notes (Signed)
Dc instructions reviewed with pt and explained. F/u care instructed. Pt voiced understanding.
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
## 2013-01-01 ENCOUNTER — Emergency Department (HOSPITAL_COMMUNITY)
Admission: EM | Admit: 2013-01-01 | Discharge: 2013-01-01 | Disposition: A | Discharge status: Home / Self Care | Payer: PRIVATE HEALTH INSURANCE | Attending: Emergency Medicine | Admitting: Emergency Medicine

## 2013-01-01 ENCOUNTER — Encounter (HOSPITAL_COMMUNITY): Payer: Self-pay | Admitting: Emergency Medicine

## 2013-01-01 DIAGNOSIS — I509 Heart failure, unspecified: Secondary | ICD-10-CM | POA: Insufficient documentation

## 2013-01-01 DIAGNOSIS — J449 Chronic obstructive pulmonary disease, unspecified: Secondary | ICD-10-CM | POA: Insufficient documentation

## 2013-01-01 DIAGNOSIS — M542 Cervicalgia: Secondary | ICD-10-CM | POA: Insufficient documentation

## 2013-01-01 DIAGNOSIS — G4730 Sleep apnea, unspecified: Secondary | ICD-10-CM | POA: Insufficient documentation

## 2013-01-01 DIAGNOSIS — I10 Essential (primary) hypertension: Secondary | ICD-10-CM | POA: Insufficient documentation

## 2013-01-01 DIAGNOSIS — E669 Obesity, unspecified: Secondary | ICD-10-CM | POA: Insufficient documentation

## 2013-01-01 DIAGNOSIS — Z8614 Personal history of Methicillin resistant Staphylococcus aureus infection: Secondary | ICD-10-CM | POA: Insufficient documentation

## 2013-01-01 DIAGNOSIS — Z86711 Personal history of pulmonary embolism: Secondary | ICD-10-CM | POA: Insufficient documentation

## 2013-01-01 DIAGNOSIS — F3289 Other specified depressive episodes: Secondary | ICD-10-CM | POA: Insufficient documentation

## 2013-01-01 DIAGNOSIS — J4489 Other specified chronic obstructive pulmonary disease: Secondary | ICD-10-CM | POA: Insufficient documentation

## 2013-01-01 DIAGNOSIS — L02416 Cutaneous abscess of left lower limb: Secondary | ICD-10-CM

## 2013-01-01 DIAGNOSIS — K219 Gastro-esophageal reflux disease without esophagitis: Secondary | ICD-10-CM | POA: Insufficient documentation

## 2013-01-01 DIAGNOSIS — R209 Unspecified disturbances of skin sensation: Secondary | ICD-10-CM | POA: Insufficient documentation

## 2013-01-01 DIAGNOSIS — Z791 Long term (current) use of non-steroidal anti-inflammatories (NSAID): Secondary | ICD-10-CM | POA: Insufficient documentation

## 2013-01-01 DIAGNOSIS — Z79899 Other long term (current) drug therapy: Secondary | ICD-10-CM | POA: Insufficient documentation

## 2013-01-01 DIAGNOSIS — Z789 Other specified health status: Secondary | ICD-10-CM | POA: Insufficient documentation

## 2013-01-01 DIAGNOSIS — I2510 Atherosclerotic heart disease of native coronary artery without angina pectoris: Secondary | ICD-10-CM | POA: Insufficient documentation

## 2013-01-01 DIAGNOSIS — L02419 Cutaneous abscess of limb, unspecified: Secondary | ICD-10-CM | POA: Insufficient documentation

## 2013-01-01 DIAGNOSIS — Z862 Personal history of diseases of the blood and blood-forming organs and certain disorders involving the immune mechanism: Secondary | ICD-10-CM | POA: Insufficient documentation

## 2013-01-01 DIAGNOSIS — Z7982 Long term (current) use of aspirin: Secondary | ICD-10-CM | POA: Insufficient documentation

## 2013-01-01 DIAGNOSIS — E119 Type 2 diabetes mellitus without complications: Secondary | ICD-10-CM | POA: Insufficient documentation

## 2013-01-01 DIAGNOSIS — M5412 Radiculopathy, cervical region: Secondary | ICD-10-CM | POA: Insufficient documentation

## 2013-01-01 DIAGNOSIS — F329 Major depressive disorder, single episode, unspecified: Secondary | ICD-10-CM | POA: Insufficient documentation

## 2013-01-01 MED ORDER — CEPHALEXIN 500 MG PO CAPS: 500.0000 mg | ORAL_CAPSULE | Freq: Four times a day (QID) | ORAL | Status: DC

## 2013-01-01 MED ORDER — PREDNISONE 10 MG PO TABS: 20.0000 mg | ORAL_TABLET | Freq: Every day | ORAL | Status: DC
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
## 2013-01-02 NOTE — ED Provider Notes (Signed)
Medical screening examination/treatment/procedure(s) were performed by non-physician practitioner and as supervising physician I was immediately available for consultation/collaboration.    Willean Schurman R Karryn Kosinski, MD 01/02/13 1536 

## 2013-01-10 ENCOUNTER — Emergency Department (HOSPITAL_COMMUNITY)
Admission: EM | Admit: 2013-01-10 | Discharge: 2013-01-10 | Disposition: A | Discharge status: Home / Self Care | Payer: PRIVATE HEALTH INSURANCE | Attending: Emergency Medicine | Admitting: Emergency Medicine

## 2013-01-10 ENCOUNTER — Encounter (HOSPITAL_COMMUNITY): Payer: Self-pay | Admitting: Emergency Medicine

## 2013-01-10 DIAGNOSIS — Z86711 Personal history of pulmonary embolism: Secondary | ICD-10-CM | POA: Insufficient documentation

## 2013-01-10 DIAGNOSIS — Z862 Personal history of diseases of the blood and blood-forming organs and certain disorders involving the immune mechanism: Secondary | ICD-10-CM | POA: Insufficient documentation

## 2013-01-10 DIAGNOSIS — J4489 Other specified chronic obstructive pulmonary disease: Secondary | ICD-10-CM | POA: Insufficient documentation

## 2013-01-10 DIAGNOSIS — K219 Gastro-esophageal reflux disease without esophagitis: Secondary | ICD-10-CM | POA: Insufficient documentation

## 2013-01-10 DIAGNOSIS — M545 Low back pain, unspecified: Secondary | ICD-10-CM | POA: Insufficient documentation

## 2013-01-10 DIAGNOSIS — Z79899 Other long term (current) drug therapy: Secondary | ICD-10-CM | POA: Insufficient documentation

## 2013-01-10 DIAGNOSIS — G8929 Other chronic pain: Secondary | ICD-10-CM | POA: Insufficient documentation

## 2013-01-10 DIAGNOSIS — Z794 Long term (current) use of insulin: Secondary | ICD-10-CM | POA: Insufficient documentation

## 2013-01-10 DIAGNOSIS — IMO0002 Reserved for concepts with insufficient information to code with codable children: Secondary | ICD-10-CM | POA: Insufficient documentation

## 2013-01-10 DIAGNOSIS — E669 Obesity, unspecified: Secondary | ICD-10-CM | POA: Insufficient documentation

## 2013-01-10 DIAGNOSIS — I509 Heart failure, unspecified: Secondary | ICD-10-CM | POA: Insufficient documentation

## 2013-01-10 DIAGNOSIS — Z792 Long term (current) use of antibiotics: Secondary | ICD-10-CM | POA: Insufficient documentation

## 2013-01-10 DIAGNOSIS — F329 Major depressive disorder, single episode, unspecified: Secondary | ICD-10-CM | POA: Insufficient documentation

## 2013-01-10 DIAGNOSIS — I2510 Atherosclerotic heart disease of native coronary artery without angina pectoris: Secondary | ICD-10-CM | POA: Insufficient documentation

## 2013-01-10 DIAGNOSIS — Z8614 Personal history of Methicillin resistant Staphylococcus aureus infection: Secondary | ICD-10-CM | POA: Insufficient documentation

## 2013-01-10 DIAGNOSIS — G8911 Acute pain due to trauma: Secondary | ICD-10-CM | POA: Insufficient documentation

## 2013-01-10 DIAGNOSIS — F3289 Other specified depressive episodes: Secondary | ICD-10-CM | POA: Insufficient documentation

## 2013-01-10 DIAGNOSIS — Z7982 Long term (current) use of aspirin: Secondary | ICD-10-CM | POA: Insufficient documentation

## 2013-01-10 DIAGNOSIS — E119 Type 2 diabetes mellitus without complications: Secondary | ICD-10-CM | POA: Insufficient documentation

## 2013-01-10 DIAGNOSIS — G4730 Sleep apnea, unspecified: Secondary | ICD-10-CM | POA: Insufficient documentation

## 2013-01-10 DIAGNOSIS — I10 Essential (primary) hypertension: Secondary | ICD-10-CM | POA: Insufficient documentation

## 2013-01-10 DIAGNOSIS — J449 Chronic obstructive pulmonary disease, unspecified: Secondary | ICD-10-CM | POA: Insufficient documentation

## 2013-01-10 DIAGNOSIS — Z791 Long term (current) use of non-steroidal anti-inflammatories (NSAID): Secondary | ICD-10-CM | POA: Insufficient documentation

## 2013-01-10 MED ORDER — MELOXICAM 7.5 MG PO TABS: 7.5000 mg | ORAL_TABLET | Freq: Every day | ORAL | Status: DC

## 2013-01-10 MED ORDER — KETOROLAC TROMETHAMINE 60 MG/2ML IM SOLN
60.0000 mg | Freq: Once | INTRAMUSCULAR | Status: AC
  Administered 2013-01-10: 60 mg via INTRAMUSCULAR

## 2013-01-10 MED ORDER — KETOROLAC TROMETHAMINE 60 MG/2ML IM SOLN
INTRAMUSCULAR | Status: AC
  Filled 2013-01-10: qty 2
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
## 2013-01-10 NOTE — ED Notes (Signed)
Pt requesting to speak with "someone higher up." Dr. Rosalia Hammersay to pt's room. Pt arguing about course of treatment. Dr. Rosalia Hammersay informed pt of need to follow up with pain clinic or PCP, or Ortho.

## 2013-01-10 NOTE — Discharge Instructions (Signed)
Chronic Back Pain   When back pain lasts longer than 3 months, it is called chronic back pain.People with chronic back pain often go through certain periods that are more intense (flare-ups).   CAUSES  Chronic back pain can be caused by wear and tear (degeneration) on different structures in your back. These structures include:   The bones of your spine (vertebrae) and the joints surrounding your spinal cord and nerve roots (facets).   The strong, fibrous tissues that connect your vertebrae (ligaments).  Degeneration of these structures may result in pressure on your nerves. This can lead to constant pain.  HOME CARE INSTRUCTIONS   Avoid bending, heavy lifting, prolonged sitting, and activities which make the problem worse.   Take brief periods of rest throughout the day to reduce your pain. Lying down or standing usually is better than sitting while you are resting.   Take over-the-counter or prescription medicines only as directed by your caregiver.  SEEK IMMEDIATE MEDICAL CARE IF:    You have weakness or numbness in one of your legs or feet.   You have trouble controlling your bladder or bowels.   You have nausea, vomiting, abdominal pain, shortness of breath, or fainting.  Document Released: 02/04/2004 Document Revised: 03/21/2011 Document Reviewed: 12/11/2010  [employer] Patient Information 2014 [employer]
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
## 2013-01-10 NOTE — ED Notes (Signed)
Pt reports "mobic swells me up. I can't take it." EDPa informed. Prescription shredded.

## 2013-01-12 ENCOUNTER — Encounter: Payer: Self-pay | Admitting: Cardiology

## 2013-01-14 ENCOUNTER — Encounter: Payer: Self-pay | Admitting: Cardiology

## 2013-01-15 ENCOUNTER — Encounter: Payer: Self-pay | Admitting: Cardiovascular Disease

## 2013-01-15 NOTE — ED Provider Notes (Signed)
History/physical exam/procedure(s) were performed by non-physician practitioner and as supervising physician I was immediately available for consultation/collaboration. I have reviewed all notes and am in agreement with care and plan.   Hilario Quarryanielle S Shavonn Convey, MD 01/15/13 [phone]

## 2013-01-16 ENCOUNTER — Encounter: Payer: Self-pay | Admitting: Cardiovascular Disease

## 2013-01-22 ENCOUNTER — Other Ambulatory Visit: Payer: Self-pay | Admitting: Orthopaedic Surgery

## 2013-01-22 DIAGNOSIS — M545 Low back pain, unspecified: Secondary | ICD-10-CM

## 2013-01-26 ENCOUNTER — Other Ambulatory Visit: Payer: PRIVATE HEALTH INSURANCE

## 2013-01-28 ENCOUNTER — Ambulatory Visit (INDEPENDENT_AMBULATORY_CARE_PROVIDER_SITE_OTHER): Payer: PRIVATE HEALTH INSURANCE | Admitting: Cardiovascular Disease

## 2013-01-28 ENCOUNTER — Encounter: Payer: Self-pay | Admitting: Cardiovascular Disease

## 2013-01-28 VITALS — BP 153/88 | HR 74 | Ht 71.0 in | Wt 271.8 lb

## 2013-01-28 DIAGNOSIS — I5032 Chronic diastolic (congestive) heart failure: Secondary | ICD-10-CM | POA: Insufficient documentation

## 2013-01-28 DIAGNOSIS — G4730 Sleep apnea, unspecified: Secondary | ICD-10-CM

## 2013-01-28 DIAGNOSIS — Z7189 Other specified counseling: Secondary | ICD-10-CM

## 2013-01-28 DIAGNOSIS — I10 Essential (primary) hypertension: Secondary | ICD-10-CM

## 2013-01-28 DIAGNOSIS — F17200 Nicotine dependence, unspecified, uncomplicated: Secondary | ICD-10-CM

## 2013-01-28 DIAGNOSIS — Z716 Tobacco abuse counseling: Secondary | ICD-10-CM

## 2013-01-28 DIAGNOSIS — I517 Cardiomegaly: Secondary | ICD-10-CM

## 2013-01-28 DIAGNOSIS — R0602 Shortness of breath: Secondary | ICD-10-CM
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
## 2013-01-28 NOTE — Patient Instructions (Signed)
Your physician has requested that you have an echocardiogram. Echocardiography is a painless test that uses sound waves to create images of your heart. It provides your doctor with information about the size and shape of your heart and how well your heart's chambers and valves are working. This procedure takes approximately one hour. There are no restrictions for this procedure. Office will contact with results via phone or letter.   Establish with primary MD - list provided  Your physician wants you to follow up in: 6 months.  You will receive a reminder letter in the mail one-two months in advance.  If you don't receive a letter, please call our office to schedule the follow up appointment

## 2013-01-31 ENCOUNTER — Other Ambulatory Visit: Payer: PRIVATE HEALTH INSURANCE

## 2013-01-31 DIAGNOSIS — R0989 Other specified symptoms and signs involving the circulatory and respiratory systems: Secondary | ICD-10-CM

## 2013-02-23 ENCOUNTER — Other Ambulatory Visit: Payer: PRIVATE HEALTH INSURANCE

## 2013-03-12 IMAGING — CR DG CHEST 1V PORT
1 series · 1 of 1 positions shown · non-contrast
Comparison: Chest 10/13/2010 and 10/24/2010.

CLINICAL DATA: Pulmonary edema.

PORTABLE CHEST - 1 VIEW

[view not recorded]
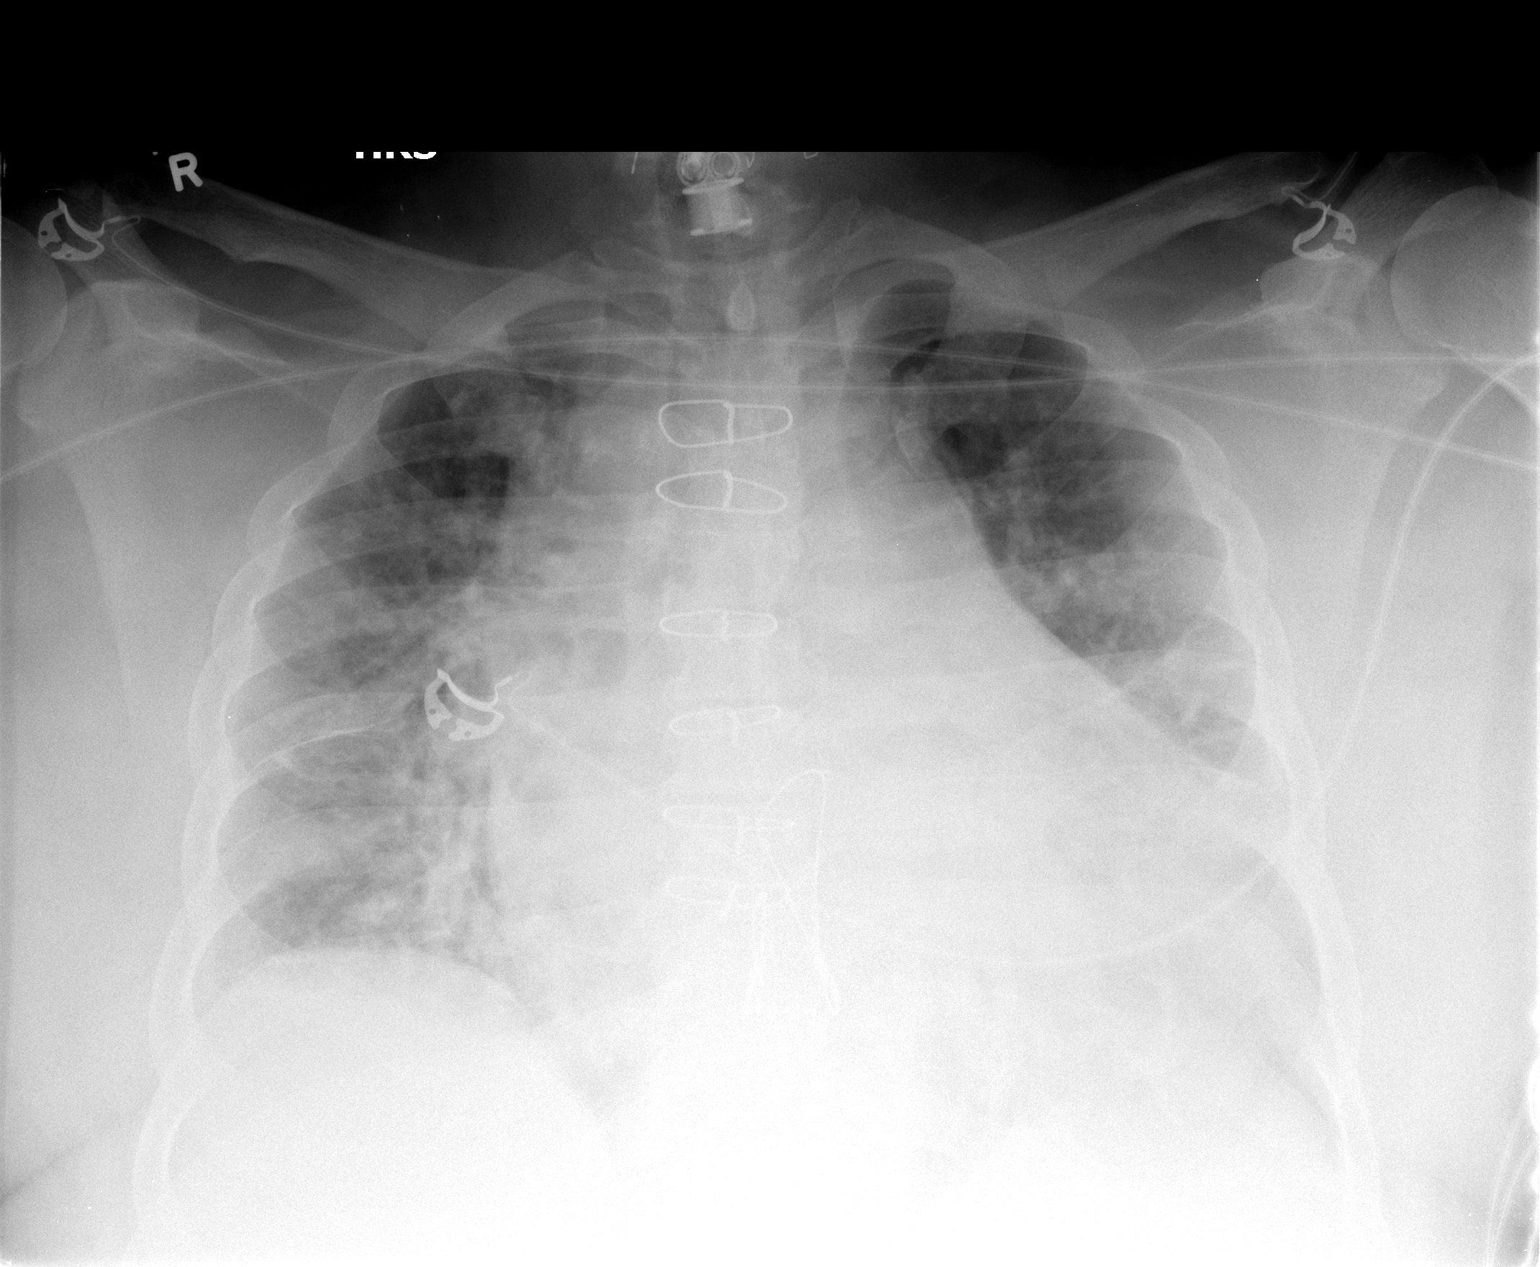

[1 of 1 positions shown; findings below may reference images not displayed]

FINDINGS: There is cardiomegaly that endotracheal tube has been
removed.  There is cardiomegaly and mild interstitial edema.  No
pneumothorax.  Left basilar atelectasis noted.
IMPRESSION: No interval change.

## 2013-03-21 ENCOUNTER — Other Ambulatory Visit: Payer: PRIVATE HEALTH INSURANCE

## 2013-04-05 ENCOUNTER — Other Ambulatory Visit: Payer: PRIVATE HEALTH INSURANCE

## 2013-04-13 ENCOUNTER — Other Ambulatory Visit: Payer: PRIVATE HEALTH INSURANCE

## 2013-05-10 ENCOUNTER — Other Ambulatory Visit: Payer: PRIVATE HEALTH INSURANCE

## 2013-06-11 ENCOUNTER — Other Ambulatory Visit: Payer: PRIVATE HEALTH INSURANCE

## 2013-06-13 ENCOUNTER — Other Ambulatory Visit: Payer: Self-pay | Admitting: Orthopaedic Surgery

## 2013-06-13 DIAGNOSIS — M545 Low back pain, unspecified: Secondary | ICD-10-CM

## 2013-06-17 ENCOUNTER — Other Ambulatory Visit: Payer: PRIVATE HEALTH INSURANCE

## 2013-06-18 ENCOUNTER — Ambulatory Visit
Admission: RE | Admit: 2013-06-18 | Discharge: 2013-06-18 | Disposition: A | Discharge status: Home / Self Care | Payer: PRIVATE HEALTH INSURANCE | Source: Ambulatory Visit | Attending: Orthopaedic Surgery | Admitting: Orthopaedic Surgery

## 2013-06-18 DIAGNOSIS — M545 Low back pain, unspecified: Secondary | ICD-10-CM

## 2013-09-09 DIAGNOSIS — R21 Rash and other nonspecific skin eruption: Secondary | ICD-10-CM | POA: Insufficient documentation

## 2014-10-24 ENCOUNTER — Emergency Department (HOSPITAL_COMMUNITY)
Admission: EM | Admit: 2014-10-24 | Discharge: 2014-10-24 | Disposition: A | Discharge status: Home / Self Care | Payer: Medicare Other | Attending: Emergency Medicine | Admitting: Emergency Medicine

## 2014-10-24 ENCOUNTER — Encounter (HOSPITAL_COMMUNITY): Payer: Self-pay | Admitting: Emergency Medicine

## 2014-10-24 ENCOUNTER — Emergency Department (HOSPITAL_COMMUNITY): Payer: Medicare Other

## 2014-10-24 DIAGNOSIS — F329 Major depressive disorder, single episode, unspecified: Secondary | ICD-10-CM | POA: Diagnosis not present

## 2014-10-24 DIAGNOSIS — E669 Obesity, unspecified: Secondary | ICD-10-CM | POA: Diagnosis not present

## 2014-10-24 DIAGNOSIS — G4730 Sleep apnea, unspecified: Secondary | ICD-10-CM | POA: Insufficient documentation

## 2014-10-24 DIAGNOSIS — Z7982 Long term (current) use of aspirin: Secondary | ICD-10-CM | POA: Diagnosis not present

## 2014-10-24 DIAGNOSIS — Z87828 Personal history of other (healed) physical injury and trauma: Secondary | ICD-10-CM | POA: Insufficient documentation

## 2014-10-24 DIAGNOSIS — Z8614 Personal history of Methicillin resistant Staphylococcus aureus infection: Secondary | ICD-10-CM | POA: Diagnosis not present

## 2014-10-24 DIAGNOSIS — Z862 Personal history of diseases of the blood and blood-forming organs and certain disorders involving the immune mechanism: Secondary | ICD-10-CM | POA: Insufficient documentation

## 2014-10-24 DIAGNOSIS — Z9889 Other specified postprocedural states: Secondary | ICD-10-CM | POA: Insufficient documentation

## 2014-10-24 DIAGNOSIS — E119 Type 2 diabetes mellitus without complications: Secondary | ICD-10-CM | POA: Insufficient documentation

## 2014-10-24 DIAGNOSIS — Z86711 Personal history of pulmonary embolism: Secondary | ICD-10-CM | POA: Diagnosis not present

## 2014-10-24 DIAGNOSIS — Z9981 Dependence on supplemental oxygen: Secondary | ICD-10-CM | POA: Diagnosis not present

## 2014-10-24 DIAGNOSIS — I2510 Atherosclerotic heart disease of native coronary artery without angina pectoris: Secondary | ICD-10-CM | POA: Diagnosis not present

## 2014-10-24 DIAGNOSIS — K219 Gastro-esophageal reflux disease without esophagitis: Secondary | ICD-10-CM | POA: Insufficient documentation

## 2014-10-24 DIAGNOSIS — J449 Chronic obstructive pulmonary disease, unspecified: Secondary | ICD-10-CM | POA: Insufficient documentation

## 2014-10-24 DIAGNOSIS — I509 Heart failure, unspecified: Secondary | ICD-10-CM | POA: Diagnosis not present

## 2014-10-24 DIAGNOSIS — G8929 Other chronic pain: Secondary | ICD-10-CM | POA: Insufficient documentation

## 2014-10-24 DIAGNOSIS — M542 Cervicalgia: Secondary | ICD-10-CM | POA: Diagnosis not present

## 2014-10-24 DIAGNOSIS — Z79899 Other long term (current) drug therapy: Secondary | ICD-10-CM | POA: Insufficient documentation

## 2014-10-24 DIAGNOSIS — I10 Essential (primary) hypertension: Secondary | ICD-10-CM | POA: Insufficient documentation

## 2014-10-24 DIAGNOSIS — Z7952 Long term (current) use of systemic steroids: Secondary | ICD-10-CM | POA: Insufficient documentation

## 2014-10-24 MED ORDER — DIAZEPAM 5 MG PO TABS: 5.0000 mg | ORAL_TABLET | Freq: Three times a day (TID) | ORAL | Status: DC | PRN

## 2014-10-24 MED ORDER — NAPROXEN 500 MG PO TABS: 500.0000 mg | ORAL_TABLET | Freq: Two times a day (BID) | ORAL | Status: DC

## 2014-10-24 MED ORDER — CYCLOBENZAPRINE HCL 5 MG PO TABS: 5.0000 mg | ORAL_TABLET | Freq: Three times a day (TID) | ORAL | Status: DC | PRN

## 2014-10-24 MED ORDER — HYDROMORPHONE HCL 1 MG/ML IJ SOLN
1.0000 mg | Freq: Once | INTRAMUSCULAR | Status: AC
  Administered 2014-10-24: 1 mg via INTRAMUSCULAR
  Filled 2014-10-24: qty 1

## 2014-10-24 NOTE — Discharge Instructions (Signed)
Chronic Pain  Chronic pain can be defined as pain that is off and on and lasts for 3-6 months or longer. Many things cause chronic pain, which can make it difficult to make a diagnosis. There are many treatment options available for chronic pain. However, finding a treatment that works well for you may require trying various approaches until the right one is found. Many people benefit from a combination of two or more types of treatment to control their pain.  SYMPTOMS   Chronic pain can occur anywhere in the body and can range from mild to very severe. Some types of chronic pain include:  · Headache.  · Low back pain.  · Cancer pain.  · Arthritis pain.  · Neurogenic pain. This is pain resulting from damage to nerves.   People with chronic pain may also have other symptoms such as:  · Depression.  · Anger.  · Insomnia.  · Anxiety.  DIAGNOSIS   Your health care provider will help diagnose your condition over time. In many cases, the initial focus will be on excluding possible conditions that could be causing the pain. Depending on your symptoms, your health care provider may order tests to diagnose your condition. Some of these tests may include:   · Blood tests.    · CT scan.    · MRI.    · X-rays.    · Ultrasounds.    · Nerve conduction studies.    You may need to see a specialist.   TREATMENT   Finding treatment that works well may take time. You may be referred to a pain specialist. He or she may prescribe medicine or therapies, such as:   · Mindful meditation or yoga.  · Shots (injections) of numbing or pain-relieving medicines into the spine or area of pain.  · Local electrical stimulation.  · Acupuncture.    · Massage therapy.    · Aroma, color, light, or sound therapy.    · Biofeedback.    · Working with a physical therapist to keep from getting stiff.    · Regular, gentle exercise.    · Cognitive or behavioral therapy.    · Group support.    Sometimes, surgery may be recommended.   HOME CARE INSTRUCTIONS    · Take all medicines as directed by your health care provider.    · Lessen stress in your life by relaxing and doing things such as listening to calming music.    · Exercise or be active as directed by your health care provider.    · Eat a healthy diet and include things such as vegetables, fruits, fish, and lean meats in your diet.    · Keep all follow-up appointments with your health care provider.    · Attend a support group with others suffering from chronic pain.  SEEK MEDICAL CARE IF:   · Your pain gets worse.    · You develop a new pain that was not there before.    · You cannot tolerate medicines given to you by your health care provider.    · You have new symptoms since your last visit with your health care provider.    SEEK IMMEDIATE MEDICAL CARE IF:   · You feel weak.    · You have decreased sensation or numbness.    · You lose control of bowel or bladder function.    · Your pain suddenly gets much worse.    · You develop shaking.  · You develop chills.  · You develop confusion.  · You develop chest pain.  · You develop shortness of breath.    MAKE SURE YOU:  ·   Document Revised: 08/29/2012 Document Reviewed: 06/22/2012 Elsevier Interactive Patient Education [employer]2016 [employer]   As discussed, make sure you take your Valium 2 hours apart from your oxycodone.  Do not use your Flexeril while you are taking this medication for muscle spasm.
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
## 2014-10-26 ENCOUNTER — Encounter (HOSPITAL_COMMUNITY): Payer: Self-pay | Admitting: Emergency Medicine

## 2014-10-26 ENCOUNTER — Emergency Department (HOSPITAL_COMMUNITY)
Admission: EM | Admit: 2014-10-26 | Discharge: 2014-10-26 | Disposition: A | Discharge status: Home / Self Care | Payer: Medicare Other | Attending: Emergency Medicine | Admitting: Emergency Medicine

## 2014-10-26 DIAGNOSIS — I509 Heart failure, unspecified: Secondary | ICD-10-CM | POA: Insufficient documentation

## 2014-10-26 DIAGNOSIS — Z8614 Personal history of Methicillin resistant Staphylococcus aureus infection: Secondary | ICD-10-CM | POA: Diagnosis not present

## 2014-10-26 DIAGNOSIS — I2510 Atherosclerotic heart disease of native coronary artery without angina pectoris: Secondary | ICD-10-CM | POA: Diagnosis not present

## 2014-10-26 DIAGNOSIS — I10 Essential (primary) hypertension: Secondary | ICD-10-CM | POA: Diagnosis not present

## 2014-10-26 DIAGNOSIS — Z7982 Long term (current) use of aspirin: Secondary | ICD-10-CM | POA: Diagnosis not present

## 2014-10-26 DIAGNOSIS — G8929 Other chronic pain: Secondary | ICD-10-CM

## 2014-10-26 DIAGNOSIS — F329 Major depressive disorder, single episode, unspecified: Secondary | ICD-10-CM | POA: Diagnosis not present

## 2014-10-26 DIAGNOSIS — E119 Type 2 diabetes mellitus without complications: Secondary | ICD-10-CM | POA: Diagnosis not present

## 2014-10-26 DIAGNOSIS — G4730 Sleep apnea, unspecified: Secondary | ICD-10-CM | POA: Insufficient documentation

## 2014-10-26 DIAGNOSIS — Z9889 Other specified postprocedural states: Secondary | ICD-10-CM | POA: Insufficient documentation

## 2014-10-26 DIAGNOSIS — Z79899 Other long term (current) drug therapy: Secondary | ICD-10-CM | POA: Diagnosis not present

## 2014-10-26 DIAGNOSIS — J449 Chronic obstructive pulmonary disease, unspecified: Secondary | ICD-10-CM | POA: Insufficient documentation

## 2014-10-26 DIAGNOSIS — Z9981 Dependence on supplemental oxygen: Secondary | ICD-10-CM | POA: Insufficient documentation

## 2014-10-26 DIAGNOSIS — Z862 Personal history of diseases of the blood and blood-forming organs and certain disorders involving the immune mechanism: Secondary | ICD-10-CM | POA: Diagnosis not present

## 2014-10-26 DIAGNOSIS — Z791 Long term (current) use of non-steroidal anti-inflammatories (NSAID): Secondary | ICD-10-CM | POA: Diagnosis not present

## 2014-10-26 DIAGNOSIS — K219 Gastro-esophageal reflux disease without esophagitis: Secondary | ICD-10-CM | POA: Diagnosis not present

## 2014-10-26 DIAGNOSIS — Z7952 Long term (current) use of systemic steroids: Secondary | ICD-10-CM | POA: Diagnosis not present

## 2014-10-26 DIAGNOSIS — E669 Obesity, unspecified: Secondary | ICD-10-CM | POA: Insufficient documentation

## 2014-10-26 DIAGNOSIS — M542 Cervicalgia: Secondary | ICD-10-CM | POA: Insufficient documentation

## 2014-10-26 DIAGNOSIS — Z86711 Personal history of pulmonary embolism: Secondary | ICD-10-CM | POA: Insufficient documentation

## 2014-10-26 HISTORY — DX: Dorsalgia, unspecified: M54.9

## 2014-10-26 HISTORY — DX: Other chronic pain: G89.29

## 2014-10-26 HISTORY — DX: Cervicalgia: M54.2

## 2014-10-26 MED ORDER — KETOROLAC TROMETHAMINE 60 MG/2ML IM SOLN
60.0000 mg | Freq: Once | INTRAMUSCULAR | Status: AC
  Administered 2014-10-26: 60 mg via INTRAMUSCULAR
  Filled 2014-10-26: qty 2

## 2014-10-26 NOTE — Discharge Instructions (Signed)

## 2014-10-26 NOTE — ED Notes (Signed)
Pt agitated on discharge due to inability to achieve level of comfort desired. Pt stated he had been discharged from his pain clinic and had no PCP to follow up with. resource information provided to patient for follow up care.
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
## 2014-11-07 ENCOUNTER — Emergency Department (HOSPITAL_COMMUNITY): Payer: Medicare Other

## 2014-11-07 ENCOUNTER — Encounter (HOSPITAL_COMMUNITY): Payer: Self-pay

## 2014-11-07 ENCOUNTER — Inpatient Hospital Stay (HOSPITAL_COMMUNITY)
Admission: EM | Admit: 2014-11-07 | Discharge: 2014-11-09 | DRG: 191 | Disposition: A | Discharge status: Home / Self Care | Payer: Medicare Other | Attending: Internal Medicine | Admitting: Internal Medicine

## 2014-11-07 DIAGNOSIS — G4733 Obstructive sleep apnea (adult) (pediatric): Secondary | ICD-10-CM | POA: Diagnosis present

## 2014-11-07 DIAGNOSIS — Z794 Long term (current) use of insulin: Secondary | ICD-10-CM

## 2014-11-07 DIAGNOSIS — Z7901 Long term (current) use of anticoagulants: Secondary | ICD-10-CM | POA: Diagnosis not present

## 2014-11-07 DIAGNOSIS — R0789 Other chest pain: Secondary | ICD-10-CM | POA: Diagnosis not present

## 2014-11-07 DIAGNOSIS — Z79899 Other long term (current) drug therapy: Secondary | ICD-10-CM | POA: Diagnosis not present

## 2014-11-07 DIAGNOSIS — I10 Essential (primary) hypertension: Secondary | ICD-10-CM | POA: Diagnosis present

## 2014-11-07 DIAGNOSIS — Z9119 Patient's noncompliance with other medical treatment and regimen: Secondary | ICD-10-CM

## 2014-11-07 DIAGNOSIS — Z86711 Personal history of pulmonary embolism: Secondary | ICD-10-CM

## 2014-11-07 DIAGNOSIS — I110 Hypertensive heart disease with heart failure: Secondary | ICD-10-CM | POA: Diagnosis present

## 2014-11-07 DIAGNOSIS — R071 Chest pain on breathing: Secondary | ICD-10-CM | POA: Diagnosis not present

## 2014-11-07 DIAGNOSIS — K219 Gastro-esophageal reflux disease without esophagitis: Secondary | ICD-10-CM | POA: Diagnosis present

## 2014-11-07 DIAGNOSIS — E119 Type 2 diabetes mellitus without complications: Secondary | ICD-10-CM | POA: Diagnosis present

## 2014-11-07 DIAGNOSIS — R0602 Shortness of breath: Secondary | ICD-10-CM | POA: Diagnosis present

## 2014-11-07 DIAGNOSIS — Z7982 Long term (current) use of aspirin: Secondary | ICD-10-CM

## 2014-11-07 DIAGNOSIS — G894 Chronic pain syndrome: Secondary | ICD-10-CM | POA: Diagnosis present

## 2014-11-07 DIAGNOSIS — I5032 Chronic diastolic (congestive) heart failure: Secondary | ICD-10-CM | POA: Diagnosis present

## 2014-11-07 DIAGNOSIS — G959 Disease of spinal cord, unspecified: Secondary | ICD-10-CM | POA: Diagnosis present

## 2014-11-07 DIAGNOSIS — R079 Chest pain, unspecified: Secondary | ICD-10-CM | POA: Diagnosis present

## 2014-11-07 DIAGNOSIS — J441 Chronic obstructive pulmonary disease with (acute) exacerbation: Principal | ICD-10-CM | POA: Diagnosis present

## 2014-11-07 LAB — CBC WITH DIFFERENTIAL/PLATELET
BASOS PCT: 0 %
Basophils Absolute: 0 10*3/uL (ref 0.0–0.1)
EOS ABS: 0 10*3/uL (ref 0.0–0.7)
Eosinophils Relative: 0 %
HCT: 43.1 % (ref 39.0–52.0)
Hemoglobin: 14.4 g/dL (ref 13.0–17.0)
Lymphocytes Relative: 5 %
Lymphs Abs: 0.6 10*3/uL — ABNORMAL LOW (ref 0.7–4.0)
MCH: 29.6 pg (ref 26.0–34.0)
MCHC: 33.4 g/dL (ref 30.0–36.0)
MCV: 88.7 fL (ref 78.0–100.0)
MONOS PCT: 1 %
Monocytes Absolute: 0.1 10*3/uL (ref 0.1–1.0)
NEUTROS ABS: 12.5 10*3/uL — AB (ref 1.7–7.7)
NEUTROS PCT: 94 %
PLATELETS: 169 10*3/uL (ref 150–400)
RBC: 4.86 MIL/uL (ref 4.22–5.81)
RDW: 14.7 % (ref 11.5–15.5)
WBC: 13.3 10*3/uL — AB (ref 4.0–10.5)

## 2014-11-07 LAB — COMPREHENSIVE METABOLIC PANEL
ALBUMIN: 4.6 g/dL (ref 3.5–5.0)
ALK PHOS: 77 U/L (ref 38–126)
ALT: 25 U/L (ref 17–63)
ANION GAP: 12 (ref 5–15)
AST: 20 U/L (ref 15–41)
BUN: 13 mg/dL (ref 6–20)
CALCIUM: 9.1 mg/dL (ref 8.9–10.3)
CO2: 27 mmol/L (ref 22–32)
Chloride: 102 mmol/L (ref 101–111)
Creatinine, Ser: 0.8 mg/dL (ref 0.61–1.24)
GFR calc non Af Amer: >60 mL/min (ref >60)
GLUCOSE: 190 mg/dL — AB (ref 65–99)
POTASSIUM: 3.4 mmol/L — AB (ref 3.5–5.1)
SODIUM: 141 mmol/L (ref 135–145)
TOTAL PROTEIN: 8.1 g/dL (ref 6.5–8.1)
Total Bilirubin: 0.9 mg/dL (ref 0.3–1.2)

## 2014-11-07 LAB — TROPONIN I: Troponin I: <0.03 ng/mL (ref <0.031)

## 2014-11-07 LAB — LIPASE, BLOOD: Lipase: 28 U/L (ref 11–51)

## 2014-11-07 LAB — MRSA PCR SCREENING: MRSA by PCR: POSITIVE — AB

## 2014-11-07 LAB — GLUCOSE, CAPILLARY: Glucose-Capillary: 225 mg/dL — ABNORMAL HIGH (ref 65–99)

## 2014-11-07 LAB — BRAIN NATRIURETIC PEPTIDE: B NATRIURETIC PEPTIDE 5: 56 pg/mL (ref 0.0–100.0)

## 2014-11-07 MED ORDER — MUPIROCIN 2 % EX OINT
1.0000 "application " | TOPICAL_OINTMENT | Freq: Two times a day (BID) | CUTANEOUS | Status: DC
  Administered 2014-11-08 – 2014-11-09 (×3): 1 via NASAL
  Filled 2014-11-07: qty 22

## 2014-11-07 MED ORDER — INSULIN ASPART 100 UNIT/ML ~~LOC~~ SOLN
0.0000 [IU] | Freq: Three times a day (TID) | SUBCUTANEOUS | Status: DC
  Administered 2014-11-07 – 2014-11-08 (×2): 5 [IU] via SUBCUTANEOUS
  Administered 2014-11-08 – 2014-11-09 (×3): 3 [IU] via SUBCUTANEOUS

## 2014-11-07 MED ORDER — ASPIRIN 81 MG PO CHEW
324.0000 mg | CHEWABLE_TABLET | Freq: Once | ORAL | Status: AC
  Administered 2014-11-07: 324 mg via ORAL
  Filled 2014-11-07: qty 4

## 2014-11-07 MED ORDER — AZITHROMYCIN 250 MG PO TABS
500.0000 mg | ORAL_TABLET | Freq: Every day | ORAL | Status: AC
  Administered 2014-11-07: 500 mg via ORAL
  Filled 2014-11-07: qty 2

## 2014-11-07 MED ORDER — IPRATROPIUM-ALBUTEROL 0.5-2.5 (3) MG/3ML IN SOLN
3.0000 mL | RESPIRATORY_TRACT | Status: AC
  Administered 2014-11-07 (×2): 3 mL via RESPIRATORY_TRACT
  Filled 2014-11-07: qty 3

## 2014-11-07 MED ORDER — METHYLPREDNISOLONE SODIUM SUCC 125 MG IJ SOLR
80.0000 mg | Freq: Two times a day (BID) | INTRAMUSCULAR | Status: DC
  Administered 2014-11-07 – 2014-11-08 (×3): 80 mg via INTRAVENOUS
  Filled 2014-11-07 (×3): qty 2

## 2014-11-07 MED ORDER — ONDANSETRON HCL 4 MG PO TABS: 4.0000 mg | ORAL_TABLET | Freq: Four times a day (QID) | ORAL | Status: DC | PRN

## 2014-11-07 MED ORDER — MORPHINE SULFATE ER 30 MG PO TBCR
60.0000 mg | EXTENDED_RELEASE_TABLET | Freq: Two times a day (BID) | ORAL | Status: DC
  Administered 2014-11-07 – 2014-11-09 (×5): 60 mg via ORAL
  Filled 2014-11-07 (×5): qty 2

## 2014-11-07 MED ORDER — SODIUM CHLORIDE 0.9 % IV SOLN: 250.0000 mL | INTRAVENOUS | Status: DC | PRN

## 2014-11-07 MED ORDER — INSULIN ASPART 100 UNIT/ML ~~LOC~~ SOLN
0.0000 [IU] | Freq: Every day | SUBCUTANEOUS | Status: DC
  Administered 2014-11-07 – 2014-11-08 (×2): 2 [IU] via SUBCUTANEOUS

## 2014-11-07 MED ORDER — METHYLPREDNISOLONE SODIUM SUCC 125 MG IJ SOLR
125.0000 mg | Freq: Once | INTRAMUSCULAR | Status: AC
  Administered 2014-11-07: 125 mg via INTRAVENOUS
  Filled 2014-11-07: qty 2

## 2014-11-07 MED ORDER — SODIUM CHLORIDE 0.9 % IJ SOLN: 3.0000 mL | INTRAMUSCULAR | Status: DC | PRN

## 2014-11-07 MED ORDER — IPRATROPIUM-ALBUTEROL 0.5-2.5 (3) MG/3ML IN SOLN
3.0000 mL | Freq: Four times a day (QID) | RESPIRATORY_TRACT | Status: DC
  Administered 2014-11-07 – 2014-11-09 (×8): 3 mL via RESPIRATORY_TRACT
  Filled 2014-11-07 (×8): qty 3

## 2014-11-07 MED ORDER — AZITHROMYCIN 250 MG PO TABS
250.0000 mg | ORAL_TABLET | Freq: Every day | ORAL | Status: DC
  Administered 2014-11-08 – 2014-11-09 (×2): 250 mg via ORAL
  Filled 2014-11-07 (×2): qty 1

## 2014-11-07 MED ORDER — IPRATROPIUM-ALBUTEROL 0.5-2.5 (3) MG/3ML IN SOLN: 3.0000 mL | Freq: Once | RESPIRATORY_TRACT | Status: AC

## 2014-11-07 MED ORDER — IOHEXOL 350 MG/ML SOLN
100.0000 mL | Freq: Once | INTRAVENOUS | Status: AC | PRN
  Administered 2014-11-07: 100 mL via INTRAVENOUS

## 2014-11-07 MED ORDER — CHLORHEXIDINE GLUCONATE CLOTH 2 % EX PADS
6.0000 | MEDICATED_PAD | Freq: Every day | CUTANEOUS | Status: DC
  Administered 2014-11-08 – 2014-11-09 (×2): 6 via TOPICAL

## 2014-11-07 MED ORDER — MORPHINE SULFATE (PF) 4 MG/ML IV SOLN
4.0000 mg | Freq: Once | INTRAVENOUS | Status: AC
  Administered 2014-11-07: 4 mg via INTRAVENOUS
  Filled 2014-11-07: qty 1

## 2014-11-07 MED ORDER — IPRATROPIUM-ALBUTEROL 0.5-2.5 (3) MG/3ML IN SOLN
3.0000 mL | RESPIRATORY_TRACT | Status: AC
  Administered 2014-11-07: 3 mL via RESPIRATORY_TRACT
  Filled 2014-11-07: qty 3

## 2014-11-07 MED ORDER — GUAIFENESIN ER 600 MG PO TB12
600.0000 mg | ORAL_TABLET | Freq: Two times a day (BID) | ORAL | Status: DC
  Administered 2014-11-07 – 2014-11-09 (×5): 600 mg via ORAL
  Filled 2014-11-07 (×6): qty 1

## 2014-11-07 MED ORDER — SODIUM CHLORIDE 0.9 % IJ SOLN
3.0000 mL | Freq: Two times a day (BID) | INTRAMUSCULAR | Status: DC
  Administered 2014-11-07 – 2014-11-09 (×5): 3 mL via INTRAVENOUS

## 2014-11-07 MED ORDER — ALBUTEROL SULFATE (2.5 MG/3ML) 0.083% IN NEBU
2.5000 mg | INHALATION_SOLUTION | Freq: Four times a day (QID) | RESPIRATORY_TRACT | Status: DC
Start: 2014-11-07 — End: 2014-11-07

## 2014-11-07 MED ORDER — ALBUTEROL SULFATE (2.5 MG/3ML) 0.083% IN NEBU: 2.5000 mg | INHALATION_SOLUTION | RESPIRATORY_TRACT | Status: DC | PRN

## 2014-11-07 MED ORDER — OXYCODONE-ACETAMINOPHEN 5-325 MG PO TABS
1.0000 | ORAL_TABLET | ORAL | Status: DC | PRN
  Administered 2014-11-07 – 2014-11-09 (×6): 1 via ORAL
  Filled 2014-11-07 (×6): qty 1

## 2014-11-07 MED ORDER — IPRATROPIUM BROMIDE 0.02 % IN SOLN: 0.5000 mg | Freq: Four times a day (QID) | RESPIRATORY_TRACT | Status: DC

## 2014-11-07 MED ORDER — NITROGLYCERIN 0.4 MG SL SUBL
0.4000 mg | SUBLINGUAL_TABLET | SUBLINGUAL | Status: DC | PRN
  Administered 2014-11-07 (×2): 0.4 mg via SUBLINGUAL
  Filled 2014-11-07 (×2): qty 1

## 2014-11-07 MED ORDER — ONDANSETRON HCL 4 MG/2ML IJ SOLN: 4.0000 mg | Freq: Four times a day (QID) | INTRAMUSCULAR | Status: DC | PRN

## 2014-11-07 MED ORDER — KETOROLAC TROMETHAMINE 15 MG/ML IJ SOLN
15.0000 mg | Freq: Four times a day (QID) | INTRAMUSCULAR | Status: DC | PRN
  Administered 2014-11-07: 15 mg via INTRAVENOUS
  Filled 2014-11-07: qty 1

## 2014-11-07 NOTE — ED Notes (Signed)
Dr Ward at bedside,  

## 2014-11-07 NOTE — Progress Notes (Signed)
Patient placed on Contact isolation precaution per Infection control policy. Patient was educated on Contact isolation,and given handouts,patient verbalized understanding..Marland Kitchen

## 2014-11-07 NOTE — ED Notes (Signed)
Pt reports sharp chest pain that started last night with radiation of pain to left arm.  Pt states pain is worse with deep breath and movement.
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
## 2014-11-07 NOTE — Care Management Note (Signed)
Case Management Note  Patient Details  Name: Cody Cordova D Teutsch MRN: [id number] Date of Birth: 08-Oct-1970  Subjective/Objective:                  Pt admitted with COPD exacerbation. Pt lives at home with mother. Pt has neb machine at home. Pt has no home O2. Pt requires no assistance with ambulation.   Action/Plan: Pt plans to return home with self care at DC. Anticipate DC home in 24 hours. Pt has ambulated in hallway, no O2 sats documented with ambulation, do not anticipate pt will require home O2 but will still need official home O2 assessment. If oxygen is needed, pt would like [facility] to provide DME.   Expected Discharge Date:    11/07/2014              Expected Discharge Plan:  Home/Self Care  In-House Referral:  NA  Discharge planning Services  CM Consult  Post Acute Care Choice:  Durable Medical Equipment Choice offered to:  Patient  DME Arranged:  Oxygen [facility]:     Status of Service:  Completed, signed off  Medicare Important Message Given:    Date Medicare IM Given:    Medicare IM give by:    Date Additional Medicare IM Given:    Additional Medicare Important Message give by:     If discussed at Long Length of Stay Meetings, dates discussed:    Additional Comments:  Malcolm MetroChildress, Charlita Brian Demske, RN 11/07/2014, 2:46 PM

## 2014-11-07 NOTE — ED Notes (Signed)
Pt c/o cough that started yesterday, non productive, chest pain that is sharp in nature and radiates to left arm, pain is worse with movement and breathing, pt has been using his inhaler with no improvement in symptoms,.

## 2014-11-07 NOTE — Care Management Important Message (Signed)
Important Message  Patient Details  Name: Cody Cordova MRN: [id number] Date of Birth: January 29, 1970   Medicare Important Message Given:  Yes-second notification given    Malcolm MetroChildress, Tanaka Gillen Demske, RN 11/07/2014, 4:10 PM

## 2014-11-07 NOTE — ED Notes (Signed)
Pt pulse ox on room air mid to upper 80's, pt reports that he wears oxygen at night, 2 lpm via [place],

## 2014-11-07 NOTE — Progress Notes (Signed)
[facility] PROGRESS NOTE  Cira Ruehomas D Boshers [id number] DOB: 1970/06/20 DOA: 11/07/2014 PCP: No PCP Per Patient  HPI/Brief narrative 44 yo male h/o PE over a year ago competed xarelto for a year, htn, copd, osa noncompliant with cpap, chronic nocturnal oxygen requirements at 2 liters [place], htn, diastolic chf comes in with one day of sscp that is worse with deep breathing with no radiation.  Assessment/Plan: 1. COPD exacerbation 1. Continued wheezing on exam 2. Presently on 2L [place] 3. Cont steroids and scheduled nebs as ordered 4. Will ambulate as tolerated 2. Chronic Pain 1. [place] 2. I have personally discussed case with the PA from pt's Pain Clinic (Dr. Whitman HeroJoyner's office). Per the PA, pt has known severe spinal disease necessitating large amounts of chronic narcotics. Pt was actually referred to Neurosurgery to address his spinal problems, however pt has yet to go. Per PA, pt's last known regimen was MS contin at 60mg . Will re-order this while here with percocet for break through pain. 3. On discharge, would limit pain meds and have patient follow up with Neurosurgery as originally planned/scheduled by pt's Pain Clinic 3. DM2 1. Will order SSI 2. Glucose 190 today 4. HTN 1. BP stable and controlled 2. Cont to monitor 5. Chronic diastolic chf 1. Stable at present 2. Clinically euvolemic 6. DVT prophylaxis 1. SCD's  Code Status: Full Family Communication: Pt in room Disposition Plan: Pending   Consultants:    Procedures:    Antibiotics: Anti-infectives    Start     Dose/Rate Route Frequency Ordered Stop   11/08/14 1000  azithromycin (ZITHROMAX) tablet 250 mg     250 mg Oral Daily 11/07/14 0733 11/12/14 0959   11/07/14 1000  azithromycin (ZITHROMAX) tablet 500 mg     500 mg Oral Daily 11/07/14 0733 11/07/14 0911       HPI/Subjective: Complains of marked back pain  Objective: Filed Vitals:   11/07/14 0503  11/07/14 0645 11/07/14 0916 11/07/14 1333  BP:  124/61    Pulse:  80    Temp:  98.2 F (36.8 C)    TempSrc:  Oral    Resp:  22    Height:  5\' 11"  (1.803 m)    Weight:  127.597 kg (281 lb 4.8 oz)    SpO2: 92% 95% 92% 93%    Intake/Output Summary (Last 24 hours) at 11/07/14 1405 Last data filed at 11/07/14 1252  Gross per 24 hour  Intake    480 ml  Output      0 ml  Net    480 ml   Filed Weights   11/07/14 0324 11/07/14 0645  Weight: 129.275 kg (285 lb) 127.597 kg (281 lb 4.8 oz)    Exam:   General:  Awake, in nad  Cardiovascular: regular, s1, s2  Respiratory: normal resp effort, no wheezing  Abdomen: soft,nondistended  Musculoskeletal: perfused, no clubbing   Data Reviewed: Basic Metabolic Panel:  Recent Labs Lab 11/07/14 0328  NA 141  K 3.4*  CL 102  CO2 27  GLUCOSE 190*  BUN 13  CREATININE 0.80  CALCIUM 9.1   Liver Function Tests:  Recent Labs Lab 11/07/14 0328  AST 20  ALT 25  ALKPHOS 77  BILITOT 0.9  PROT 8.1  ALBUMIN 4.6    Recent Labs Lab 11/07/14 0328  LIPASE 28   No results for input(s): AMMONIA in the last 168 hours. CBC:  Recent Labs Lab 11/07/14 0328  WBC 13.3*  NEUTROABS 12.5*  HGB 14.4  HCT 43.1  MCV 88.7  PLT 169   Cardiac Enzymes:  Recent Labs Lab 11/07/14 0328 11/07/14 0810  TROPONINI <0.03 <0.03   BNP (last 3 results)  Recent Labs  11/07/14 0328  BNP 56.0    ProBNP (last 3 results) No results for input(s): PROBNP in the last 8760 hours.  CBG: No results for input(s): GLUCAP in the last 168 hours.  No results found for this or any previous visit (from the past 240 hour(s)).   Studies: Ct Angio Chest Pe W/cm &/or Wo Cm  11/07/2014  CLINICAL DATA:  Acute onset of central chest pain and shortness of breath. Diffuse chest tightness, radiating to the left arm. Initial encounter. EXAM: CT ANGIOGRAPHY CHEST WITH CONTRAST TECHNIQUE: Multidetector CT imaging of the chest was performed using the  standard protocol during bolus administration of intravenous contrast. Multiplanar CT image reconstructions and MIPs were obtained to evaluate the vascular anatomy. CONTRAST:  OMNIPAQUE IOHEXOL 350 MG/ML SOLN COMPARISON:  Chest radiograph performed 11/06/2014 FINDINGS: There is no evidence of pulmonary embolus. Mild patchy bilateral airspace opacities may reflect atelectasis or scarring, more prominent than on the prior study. Associated calcification is noted at the right lung base. There is no evidence of pleural effusion or pneumothorax. No masses are identified; no abnormal focal contrast enhancement is seen. The patient is status post median sternotomy. The mediastinum is unremarkable in appearance. No mediastinal lymphadenopathy is seen. No pericardial effusion is identified. The great vessels are grossly unremarkable in appearance. No axillary lymphadenopathy is seen. The visualized portions of the thyroid gland are unremarkable in appearance. The visualized portions of the liver and spleen are unremarkable. The visualized portions of the pancreas, gallbladder, stomach, adrenal glands and kidneys are within normal limits. No acute osseous abnormalities are seen. Review of the MIP images confirms the above findings. IMPRESSION: 1. No evidence of pulmonary embolus. 2. Mild patchy bilateral airspace opacities may reflect atelectasis or scarring, more prominent than on the prior study. Associated calcification at the right lung base, stable in appearance. Electronically Signed   By: Roanna Raider M.D.   On: 11/07/2014 04:52    Scheduled Meds: . [START ON 11/08/2014] azithromycin  250 mg Oral Daily  . guaiFENesin  600 mg Oral BID  . ipratropium-albuterol  3 mL Nebulization Q6H  . methylPREDNISolone (SOLU-MEDROL) injection  80 mg Intravenous Q12H  . morphine  60 mg Oral Q12H  . sodium chloride  3 mL Intravenous Q12H   Continuous Infusions:   Principal Problem:   COPD exacerbation (HCC) Active  Problems:   Cervical myelopathy (HCC)   Diabetes mellitus (HCC)   HTN (hypertension)   Chronic diastolic heart failure (HCC)   Chest pain   Nakisha Chai K  [facility] Pager [phone]. If 7PM-7AM, please contact night-coverage at [email or web address], password [facility] 11/07/2014, 2:05 PM  LOS: 0 days

## 2014-11-07 NOTE — ED Provider Notes (Signed)
TIME SEEN: 3:30 AM  CHIEF COMPLAINT: Chest pain, shortness of breath, cough  HPI: Pt is a 44 y.o. male with history of hypertension, COPD, mild nonobstructive coronary artery disease seen on cardiac catheterization in 2009, pulmonary embolus in 2012 On anticoagulation, history of CHF, diabetes who presents to the emergency department with complaints of chest pain and shortness of breath. States the pain is above the epigastric region and a sharp and that he has a diffuse tightness across the chest that did radiate into the left arm analysis gone. Has associated shortness of breath, dizziness. No nausea or diaphoresis. States that pain feels similar to his prior pulmonary embolus. States pain started at 8 PM last night while lying in bed. Worse with deep inspiration. He states that he also has been having a dry cough but no fever. He is also wheezing. States he wears 2 L of oxygen at night.  ROS: See HPI Constitutional: no fever  Eyes: no drainage  ENT: no runny nose   Cardiovascular:  chest pain  Resp:  SOB  GI: no vomiting GU: no dysuria Integumentary: no rash  Allergy: no hives  Musculoskeletal: no leg swelling  Neurological: no slurred speech ROS otherwise negative  PAST MEDICAL HISTORY/PAST SURGICAL HISTORY:  Past Medical History  Diagnosis Date  . Hypertension   . Asthma   . COPD (chronic obstructive pulmonary disease) (HCC)   . CAD (coronary artery disease)   . Obesity   . Venous insufficiency   . Pulmonary embolus (HCC)   . Anemia   . CHF (congestive heart failure) (HCC)   . Sleep apnea     CPAP at home  . Depression   . GERD (gastroesophageal reflux disease)   . Diabetes mellitus     insulin dependent  . MRSA (methicillin resistant staph aureus) culture positive 10/12    skin lesions  . Cervical myelopathy (HCC)   . Difficult intubation     [facility]  . Chronic neck and back pain     MEDICATIONS:  Prior to Admission medications   Medication Sig  Start Date End Date Taking? Authorizing Provider  amitriptyline (ELAVIL) 10 MG tablet Take 10 mg by mouth at bedtime. 10/16/14  Yes Historical Provider, MD  aspirin EC 81 MG tablet Take 81 mg by mouth every evening.    Yes Historical Provider, MD  calcipotriene-betamethasone (TACLONEX) ointment Apply 1 application topically 2 (two) times daily.    Yes Historical Provider, MD  diazepam (VALIUM) 5 MG tablet Take 1 tablet (5 mg total) by mouth every 8 (eight) hours as needed for muscle spasms. 10/24/14  Yes Burgess AmorJulie Idol, PA-C  DULoxetine (CYMBALTA) 30 MG capsule Take 30 mg by mouth daily.   Yes Historical Provider, MD  gabapentin (NEURONTIN) 300 MG capsule Take 300 mg by mouth 3 (three) times daily. 10/16/14  Yes Historical Provider, MD  INVOKANA 300 MG TABS tablet Take 300 mg by mouth daily. 10/16/14  Yes Historical Provider, MD  lidocaine (XYLOCAINE) 5 % ointment Apply 1 application topically daily as needed for mild pain or moderate pain.  10/17/14  Yes Historical Provider, MD  loratadine (CLARITIN) 10 MG tablet Take 10 mg by mouth daily.   Yes Historical Provider, MD  losartan (COZAAR) 100 MG tablet Take 100 mg by mouth daily. 09/30/14  Yes Historical Provider, MD  montelukast (SINGULAIR) 10 MG tablet Take 1 tablet (10 mg total) by mouth at bedtime. 01/06/11  Yes Jeanella CrazeBrandi L Ollis, NP  naproxen (NAPROSYN) 500 MG  tablet Take 1 tablet (500 mg total) by mouth 2 (two) times daily with a meal. 10/24/14  Yes Burgess Amor, PA-C  oxyCODONE-acetaminophen (PERCOCET) 7.5-325 MG tablet Take 1 tablet by mouth every 6 (six) hours as needed. For pain 10/16/14  Yes Historical Provider, MD  pantoprazole (PROTONIX) 40 MG tablet Take 40 mg by mouth daily.   Yes Historical Provider, MD  potassium chloride SA (K-DUR,KLOR-CON) 20 MEQ tablet Take 20 mEq by mouth 2 (two) times daily.    Yes Historical Provider, MD  PROAIR HFA 108 (90 BASE) MCG/ACT inhaler Inhale 1-2 puffs into the lungs every 6 (six) hours as needed for wheezing or  shortness of breath.  09/30/14  Yes Historical Provider, MD  ranitidine (ZANTAC) 150 MG tablet Take 150 mg by mouth 2 (two) times daily.   Yes Historical Provider, MD  REXULTI 2 MG TABS Take 2 mg by mouth at bedtime.  10/16/14  Yes Historical Provider, MD  spironolactone (ALDACTONE) 25 MG tablet Take 1 tablet (25 mg total) by mouth daily. 11/18/10 11/07/14 Yes Daniel J Angiulli, PA-C  TACLONEX external suspension Apply 1 application topically daily as needed. For irritation 10/16/14  Yes Historical Provider, MD  tiZANidine (ZANAFLEX) 2 MG tablet Take 2 mg by mouth daily as needed for muscle spasms.  10/16/14  Yes Historical Provider, MD  traZODone (DESYREL) 50 MG tablet Take 50 mg by mouth at bedtime.   Yes Historical Provider, MD  doxycycline (VIBRA-TABS) 100 MG tablet Take 100 mg by mouth 2 (two) times daily. 10 day course starting on 10/20/2014 10/20/14   Historical Provider, MD  ketoconazole (NIZORAL) 2 % shampoo Apply 1 application topically daily.     Historical Provider, MD    ALLERGIES:  Allergies  Allergen Reactions  . Sulfa Antibiotics Shortness Of Breath  . Iohexol Other (See Comments)    Caused hypertension during IVP 1994, USES 13 HR PREP   . Ivp Dye [Iodinated Diagnostic Agents]   . Mobic [Meloxicam]   . Tape Other (See Comments)    Redness     SOCIAL HISTORY:  Social History  Substance Use Topics  . Smoking status: Never Smoker   . Smokeless tobacco: Not on file  . Alcohol Use: No    FAMILY HISTORY: No family history on file.  EXAM: BP 168/96 mmHg  Pulse 93  Temp(Src) 98.5 F (36.9 C) (Oral)  Resp 22  Ht  (1.803 m)  Wt 285 lb (129.275 kg)  BMI 39.77 kg/m2  SpO2 91% CONSTITUTIONAL: Alert and oriented and responds appropriately to questions. Chronically ill-appearing, appears in mild respiratory distress HEAD: Normocephalic EYES: Conjunctivae clear, PERRL ENT: normal nose; no rhinorrhea; moist mucous membranes; pharynx without lesions noted NECK: Supple,  no meningismus, no LAD  CARD: RRR; S1 and S2 appreciated; no murmurs, no clicks, no rubs, no gallops RESP: Patient is tachypneic and speaking slightly shorter sentences than normal, diffuse expiratory wheezing, diminished at bases, no rhonchi or rales, patient is hypoxic on room air with sats in the mid 80s CHEST:  Chest wall nontender without crepitus, ecchymosis or deformity, no lesions noted to the chest wall or rash ABD/GI: Normal bowel sounds; non-distended; soft, very mildly tender in the epigastric region, no rebound, no guarding, no peritoneal signs BACK:  The back appears normal and is non-tender to palpation, there is no CVA tenderness EXT: Normal ROM in all joints; non-tender to palpation; no edema; normal capillary refill; no cyanosis, no calf tenderness or swelling    SKIN: Normal color  for age and race; warm NEURO: Moves all extremities equally, sensation to light touch intact diffusely, cranial nerves II through XII intact PSYCH: The patient's mood and manner are appropriate. Grooming and personal hygiene are appropriate.  MEDICAL DECISION MAKING: Patient here with chest pain. Has multiple risk factors for ACS. Also reports this feels similar to her prior pulmonary embolus. He also appears to be having a COPD exacerbation and is tachypneic, wheezing and speaking short sentences. Patient also has a history of CHF. We'll obtain cardiac labs including troponin and BNP. We'll obtain CT of patient's chest to evaluate for possible pulmonary was. We'll give Solu-Medrol, duo nebs. Anticipate patient will need admission.  Patient has a listed allergy to IV dye. States that he stay with IV dye his blood pressure became elevated. Denies any angioedema, difficulty swallowing, speaking or breathing. No wheezing or hypotension. No rash. Patient states he feels comfortable having another CT scan. Last CT was in 2012 and he tolerated diet that time without difficulty. Discussed with radiology technician  who discussed case with radiologist and we all feel comfortable giving the patient IV dye to obtain a CT angio of patient's chest to rule out pulmonary embolus.  ED PROGRESS: 5:00 AM  Pt's labs unremarkable including negative troponin and normal BNP. CT of the chest shows no pulmonary embolus. He does have atelectasis versus scarring. Patient reports feeling better after morphine and DuoNeb treatments. He still has diffuse secretory wheezing but is now open, moving more air and his tachypnea has improved. We'll continue duo nebs. He has received IV steroids. Discussed with Dr. Onalee Hua with hospitalist service who agrees on admission to telemetry, observation.     EKG Interpretation  Date/Time:  Friday November 07 2014 03:22:36 EDT Ventricular Rate:  94 PR Interval:  212 QRS Duration: 93 QT Interval:  400 QTC Calculation: 500 R Axis:   17 Text Interpretation:  Sinus rhythm Prolonged PR interval Anteroseptal infarct, old Prolonged QT interval Confirmed by Kimiyo Carmicheal,  DO, Branden Vine [phone]) on 11/07/2014 3:28:55 AM         Layla Maw Seryna Marek, DO 11/07/14 0500

## 2014-11-07 NOTE — Progress Notes (Signed)
Patient ambulated in hallway oxygen saturation was 100 percent room air. No c/o pain or discomfort noted at this time. Will continue to monitor patient.

## 2014-11-08 DIAGNOSIS — J441 Chronic obstructive pulmonary disease with (acute) exacerbation: Principal | ICD-10-CM

## 2014-11-08 DIAGNOSIS — R071 Chest pain on breathing: Secondary | ICD-10-CM

## 2014-11-08 DIAGNOSIS — I10 Essential (primary) hypertension: Secondary | ICD-10-CM

## 2014-11-08 LAB — BASIC METABOLIC PANEL
ANION GAP: 9 (ref 5–15)
BUN: 14 mg/dL (ref 6–20)
CHLORIDE: 99 mmol/L — AB (ref 101–111)
CO2: 30 mmol/L (ref 22–32)
Calcium: 9 mg/dL (ref 8.9–10.3)
Creatinine, Ser: 0.8 mg/dL (ref 0.61–1.24)
GFR calc Af Amer: >60 mL/min (ref >60)
GFR calc non Af Amer: >60 mL/min (ref >60)
GLUCOSE: 181 mg/dL — AB (ref 65–99)
POTASSIUM: 3.6 mmol/L (ref 3.5–5.1)
Sodium: 138 mmol/L (ref 135–145)

## 2014-11-08 LAB — CBC
HEMATOCRIT: 40.5 % (ref 39.0–52.0)
HEMOGLOBIN: 13.2 g/dL (ref 13.0–17.0)
MCH: 28.9 pg (ref 26.0–34.0)
MCHC: 32.6 g/dL (ref 30.0–36.0)
MCV: 88.6 fL (ref 78.0–100.0)
Platelets: 172 10*3/uL (ref 150–400)
RBC: 4.57 MIL/uL (ref 4.22–5.81)
RDW: 14.8 % (ref 11.5–15.5)
WBC: 13 10*3/uL — ABNORMAL HIGH (ref 4.0–10.5)

## 2014-11-08 LAB — GLUCOSE, CAPILLARY
GLUCOSE-CAPILLARY: 205 mg/dL — AB (ref 65–99)
Glucose-Capillary: 204 mg/dL — ABNORMAL HIGH (ref 65–99)
Glucose-Capillary: 200 mg/dL — ABNORMAL HIGH (ref 65–99)
Glucose-Capillary: 210 mg/dL — ABNORMAL HIGH (ref 65–99)
Glucose-Capillary: 193 mg/dL — ABNORMAL HIGH (ref 65–99)

## 2014-11-08 MED ORDER — PREDNISONE 20 MG PO TABS
20.0000 mg | ORAL_TABLET | Freq: Two times a day (BID) | ORAL | Status: DC
  Administered 2014-11-08 – 2014-11-09 (×2): 20 mg via ORAL
  Filled 2014-11-08 (×2): qty 1

## 2014-11-08 NOTE — Discharge Summary (Signed)
Physician Discharge Summary  Cody Cordova ZOX:[id number] DOB: 07-05-1970 DOA: 11/07/2014  PCP: No PCP Per Patient  Admit date: 11/07/2014 Discharge date: 11/08/2014  Time spent: 35 minutes  Recommendations for Outpatient Follow-up:  1. Need to follow up with PCP next week.  2. Follow up with neurosurgery for back pain.  Discharge Diagnoses:  Principal Problem:   COPD exacerbation (HCC) Active Problems:   Cervical myelopathy (HCC)   Diabetes mellitus (HCC)   HTN (hypertension)   Chronic diastolic heart failure (HCC)   Chest pain   Discharge Condition:  Improve.  No SOB or DOE.   Diet recommendation: carb modified diet.   Filed Weights   11/07/14 0324 11/07/14 0645  Weight: 129.275 kg (285 lb) 127.597 kg (281 lb 4.8 oz)    History of present illness: Patient was admitted for wheezing and SOB, with hx of COPD, by Dr Onalee Hua on Nov 07, 2014.  As per her H and P:  " 44 yo male h/o PE over a year ago competed xarelto for a year, htn, copd, osa noncompliant with cpap, chronic nocturnal oxygen requirements at 2 liters [place], htn, diastolic chf comes in with one day of sscp that is worse with deep breathing with no radiation. Pt reports he has been more sob than usual and wheezing. Pt reports that this is how it felt when he had his PE several years ago. He says he has been wheezing and coughing some. No fevers. No leg swelling or pain. Pain is at rest and with deep inspiration mostly. Pain is also reproducible when he presses on it. He does not normally have chest pain or tightness when he wheezes. He had a heart cath several years ago that showed Nonobst CAD. Pt pain is better after several albuterol neb treatments and morphine. Pt referred for admission for possible underlying ACS and copde.  Also of note, pt reports the pain clinic would not see him and he ran out of his chronic pain meds, he says they would not see him because he was told he needed surgery on his  neck.   Hospital Course: Patient was admitted and was r/out with serial troponins, and they were all negative.  He was given steroid and nebs for his wheezing, and was given Zpak.  He progressed well, and felt less SOB and wheezing improved.  He does have chronic pain syndrome, and was dismissed from pain clinic in [place].  He was given some narcotic while in the hospital, after discussing with the PA at his pain clinic.  He needs to follow up with neurosurgery in consideration for definitive surgical correction for his back pain.  He is anxious to go home, and is stable for discharge.  I will give him Prednisone  BID for 5 days.  He needs to follow up with a local PCP for his general medical care as well.    Consultations:  None.   Discharge Exam: Filed Vitals:   11/08/14 0547  BP: 147/91  Pulse: 61  Temp: 98.2 F (36.8 C)  Resp: 15    Current Discharge Medication List    CONTINUE these medications which have NOT CHANGED   Details  amitriptyline (ELAVIL) 10 MG tablet Take 10 mg by mouth at bedtime.    aspirin EC 81 MG tablet Take 81 mg by mouth every evening.     calcipotriene-betamethasone (TACLONEX) ointment Apply 1 application topically 2 (two) times daily.     clobetasol cream (TEMOVATE) 0.05 % Apply 1  application topically 2 (two) times daily as needed. irritation    diazepam (VALIUM) 5 MG tablet Take 1 tablet (5 mg total) by mouth every 8 (eight) hours as needed for muscle spasms. Qty: 15 tablet, Refills: 0    DULoxetine (CYMBALTA) 30 MG capsule Take 30 mg by mouth daily.    gabapentin (NEURONTIN) 300 MG capsule Take 300 mg by mouth 3 (three) times daily.    INVOKANA 300 MG TABS tablet Take 300 mg by mouth daily.    lidocaine (XYLOCAINE) 5 % ointment Apply 1 application topically daily as needed for mild pain or moderate pain.     loratadine (CLARITIN) 10 MG tablet Take 10 mg by mouth daily.    losartan (COZAAR) 100 MG tablet Take 100 mg by mouth daily.     montelukast (SINGULAIR) 10 MG tablet Take 1 tablet (10 mg total) by mouth at bedtime.    oxyCODONE-acetaminophen (PERCOCET) 7.5-325 MG tablet Take 1 tablet by mouth every 6 (six) hours as needed. For pain    pantoprazole (PROTONIX) 40 MG tablet Take 40 mg by mouth daily.    potassium chloride SA (K-DUR,KLOR-CON) 20 MEQ tablet Take 20 mEq by mouth 2 (two) times daily.     PROAIR HFA 108 (90 BASE) MCG/ACT inhaler Inhale 1-2 puffs into the lungs every 6 (six) hours as needed for wheezing or shortness of breath.     ranitidine (ZANTAC) 150 MG tablet Take 150 mg by mouth 2 (two) times daily.    spironolactone (ALDACTONE) 25 MG tablet Take 1 tablet (25 mg total) by mouth daily. Qty: 60 tablet, Refills: 1    traZODone (DESYREL) 50 MG tablet Take 50 mg by mouth at bedtime.      STOP taking these medications     ketoconazole (NIZORAL) 2 % shampoo      naproxen (NAPROSYN) 500 MG tablet      REXULTI 2 MG TABS      TACLONEX external suspension      tiZANidine (ZANAFLEX) 2 MG tablet      doxycycline (VIBRA-TABS) 100 MG tablet        Allergies  Allergen Reactions  . Sulfa Antibiotics Shortness Of Breath  . Iohexol Other (See Comments)    Caused hypertension during IVP 1994, USES 13 HR PREP   . Ivp Dye [Iodinated Diagnostic Agents]   . Mobic [Meloxicam]   . Tape Other (See Comments)    Redness       The results of significant diagnostics from this hospitalization (including imaging, microbiology, ancillary and laboratory) are listed below for reference.    Significant Diagnostic Studies: Dg Cervical Spine Complete  10/24/2014  CLINICAL DATA:  44 year old male with chronic cervical spine pain radiating into right arm. History of 3 prior cervical spine surgeries. EXAM: CERVICAL SPINE  4+ VIEWS COMPARISON:  06/19/2014 CT, 06/18/2013 MR and 12/28/2012 radiographs. FINDINGS: Anterior plate and screw fixation from C3-C6 with C6-7 interbody prosthesis again noted. There is no  evidence of acute fracture, subluxation or prevertebral soft tissue swelling noted. Mild degenerative disc disease and spondylosis at C2-3 again noted. Mild right bony foraminal narrowing at C3-4 appears unchanged from 2014. No focal bony lesions are present. IMPRESSION: No evidence of acute abnormality. Surgical fusion changes from C3-C7 without complicating features. Electronically Signed   By: Harmon Pier M.D.   On: 10/24/2014 14:28   Ct Angio Chest Pe W/cm &/or Wo Cm  11/07/2014  CLINICAL DATA:  Acute onset of central chest pain and shortness of breath.  Diffuse chest tightness, radiating to the left arm. Initial encounter. EXAM: CT ANGIOGRAPHY CHEST WITH CONTRAST TECHNIQUE: Multidetector CT imaging of the chest was performed using the standard protocol during bolus administration of intravenous contrast. Multiplanar CT image reconstructions and MIPs were obtained to evaluate the vascular anatomy. CONTRAST:  100mL OMNIPAQUE IOHEXOL 350 MG/ML SOLN COMPARISON:  Chest radiograph performed 11/06/2014 FINDINGS: There is no evidence of pulmonary embolus. Mild patchy bilateral airspace opacities may reflect atelectasis or scarring, more prominent than on the prior study. Associated calcification is noted at the right lung base. There is no evidence of pleural effusion or pneumothorax. No masses are identified; no abnormal focal contrast enhancement is seen. The patient is status post median sternotomy. The mediastinum is unremarkable in appearance. No mediastinal lymphadenopathy is seen. No pericardial effusion is identified. The great vessels are grossly unremarkable in appearance. No axillary lymphadenopathy is seen. The visualized portions of the thyroid gland are unremarkable in appearance. The visualized portions of the liver and spleen are unremarkable. The visualized portions of the pancreas, gallbladder, stomach, adrenal glands and kidneys are within normal limits. No acute osseous abnormalities are seen.  Review of the MIP images confirms the above findings. IMPRESSION: 1. No evidence of pulmonary embolus. 2. Mild patchy bilateral airspace opacities may reflect atelectasis or scarring, more prominent than on the prior study. Associated calcification at the right lung base, stable in appearance. Electronically Signed   By: Roanna RaiderJeffery  Chang M.D.   On: 11/07/2014 04:52    Microbiology: Recent Results (from the past 240 hour(s))  MRSA PCR Screening     Status: Abnormal   Collection Time: 11/07/14  4:40 PM  Result Value Ref Range Status   MRSA by PCR POSITIVE (A) NEGATIVE Final    Comment:        The GeneXpert MRSA Assay (FDA approved for NASAL specimens only), is one component of a comprehensive MRSA colonization surveillance program. It is not intended to diagnose MRSA infection nor to guide or monitor treatment for MRSA infections. RESULT CALLED TO, READ BACK BY AND VERIFIED WITH: JOHNSON,B ON 11/07/14 AT 2050 BY LOY,C      Labs: Basic Metabolic Panel:  Recent Labs Lab 11/07/14 0328 11/08/14 0610  NA 141 138  K 3.4* 3.6  CL 102 99*  CO2 27 30  GLUCOSE 190* 181*  BUN 13 14  CREATININE 0.80 0.80  CALCIUM 9.1 9.0   Liver Function Tests:  Recent Labs Lab 11/07/14 0328  AST 20  ALT 25  ALKPHOS 77  BILITOT 0.9  PROT 8.1  ALBUMIN 4.6    Recent Labs Lab 11/07/14 0328  LIPASE 28   CBC:  Recent Labs Lab 11/07/14 0328 11/08/14 0610  WBC 13.3* 13.0*  NEUTROABS 12.5*  --   HGB 14.4 13.2  HCT 43.1 40.5  MCV 88.7 88.6  PLT 169 172   Cardiac Enzymes:  Recent Labs Lab 11/07/14 0328 11/07/14 0810 11/07/14 1413 11/07/14 1906  TROPONINI <0.03 <0.03 <0.03 <0.03    Recent Labs  11/07/14 0328  BNP 56.0    CBG:  Recent Labs Lab 11/07/14 1619 11/07/14 2146 11/08/14 0739 11/08/14 1119  GLUCAP 225* 204* 200* 210*   Signed:  Ravleen Ries  [facility] 11/08/2014, 11:39 AM

## 2014-11-09 DIAGNOSIS — R0789 Other chest pain: Secondary | ICD-10-CM

## 2014-11-09 LAB — GLUCOSE, CAPILLARY: Glucose-Capillary: 176 mg/dL — ABNORMAL HIGH (ref 65–99)

## 2014-11-09 MED ORDER — PREDNISONE 20 MG PO TABS: 20.0000 mg | ORAL_TABLET | Freq: Two times a day (BID) | ORAL | Status: DC

## 2014-11-09 NOTE — Progress Notes (Signed)
SATURATION QUALIFICATIONS: (This note is used to comply with regulatory documentation for home oxygen)  Patient Saturations on Room Air at Rest = 99 %  Patient Saturations on Room Air while Ambulating = 98 %   

## 2014-11-09 NOTE — Discharge Summary (Addendum)
Patient stayed another day as no discharge order was found. As per discharge summary yesterday:  "  Physician Discharge Summary  Cody Cordova BJY:[id number] DOB: 1970-11-23 DOA: 11/07/2014  PCP: No PCP Per Patient  Admit date: 11/07/2014 Discharge date: 11/08/2014  Time spent: 35 minutes  Recommendations for Outpatient Follow-up:  1. Need to follow up with PCP next week.  2. Follow up with neurosurgery for back pain.  Discharge Diagnoses:  Principal Problem:  COPD exacerbation (HCC) Active Problems:  Cervical myelopathy (HCC)  Diabetes mellitus (HCC)  HTN (hypertension)  Chronic diastolic heart failure (HCC)  Chest pain   Discharge Condition: Improve. No SOB or DOE.   Diet recommendation: carb modified diet.   Filed Weights   11/07/14 0324 11/07/14 0645  Weight: 129.275 kg (285 lb) 127.597 kg (281 lb 4.8 oz)    History of present illness: Patient was admitted for wheezing and SOB, with hx of COPD, by Dr Onalee Hua on Nov 07, 2014. As per her H and P: " 44 yo male h/o PE over a year ago competed xarelto for a year, htn, copd, osa noncompliant with cpap, chronic nocturnal oxygen requirements at 2 liters [place], htn, diastolic chf comes in with one day of sscp that is worse with deep breathing with no radiation. Pt reports he has been more sob than usual and wheezing. Pt reports that this is how it felt when he had his PE several years ago. He says he has been wheezing and coughing some. No fevers. No leg swelling or pain. Pain is at rest and with deep inspiration mostly. Pain is also reproducible when he presses on it. He does not normally have chest pain or tightness when he wheezes. He had a heart cath several years ago that showed Nonobst CAD. Pt pain is better after several albuterol neb treatments and morphine. Pt referred for admission for possible underlying ACS and copde.  Also of note, pt reports the pain clinic would not see him and he ran  out of his chronic pain meds, he says they would not see him because he was told he needed surgery on his neck.   Hospital Course: Patient was admitted and was r/out with serial troponins, and they were all negative. He was given steroid and nebs for his wheezing, and was given Zpak. He progressed well, and felt less SOB and wheezing improved. He does have chronic pain syndrome, and was dismissed from pain clinic in [place]. He was given some narcotic while in the hospital, after discussing with the PA at his pain clinic. He needs to follow up with neurosurgery in consideration for definitive surgical correction for his back pain. He is anxious to go home, and is stable for discharge. I will give him Prednisone  BID for 5 days. He needs to follow up with a local PCP for his general medical care as well.   Consultations:  None.  Discharge Exam: Filed Vitals:   11/08/14 0547  BP: 147/91  Pulse: 61  Temp: 98.2 F (36.8 C)  Resp: 15    Current Discharge Medication List    CONTINUE these medications which have NOT CHANGED   Details  amitriptyline (ELAVIL) 10 MG tablet Take 10 mg by mouth at bedtime.    aspirin EC 81 MG tablet Take 81 mg by mouth every evening.     calcipotriene-betamethasone (TACLONEX) ointment Apply 1 application topically 2 (two) times daily.     clobetasol cream (TEMOVATE) 0.05 % Apply 1 application topically  2 (two) times daily as needed. irritation    diazepam (VALIUM) 5 MG tablet Take 1 tablet (5 mg total) by mouth every 8 (eight) hours as needed for muscle spasms. Qty: 15 tablet, Refills: 0    DULoxetine (CYMBALTA) 30 MG capsule Take 30 mg by mouth daily.    gabapentin (NEURONTIN) 300 MG capsule Take 300 mg by mouth 3 (three) times daily.    INVOKANA 300 MG TABS tablet Take 300 mg by mouth daily.    lidocaine (XYLOCAINE) 5 % ointment Apply 1 application topically daily as needed for mild pain or moderate  pain.     loratadine (CLARITIN) 10 MG tablet Take 10 mg by mouth daily.    losartan (COZAAR) 100 MG tablet Take 100 mg by mouth daily.    montelukast (SINGULAIR) 10 MG tablet Take 1 tablet (10 mg total) by mouth at bedtime.    oxyCODONE-acetaminophen (PERCOCET) 7.5-325 MG tablet Take 1 tablet by mouth every 6 (six) hours as needed. For pain    pantoprazole (PROTONIX) 40 MG tablet Take 40 mg by mouth daily.    potassium chloride SA (K-DUR,KLOR-CON) 20 MEQ tablet Take 20 mEq by mouth 2 (two) times daily.     PROAIR HFA 108 (90 BASE) MCG/ACT inhaler Inhale 1-2 puffs into the lungs every 6 (six) hours as needed for wheezing or shortness of breath.     ranitidine (ZANTAC) 150 MG tablet Take 150 mg by mouth 2 (two) times daily.    spironolactone (ALDACTONE) 25 MG tablet Take 1 tablet (25 mg total) by mouth daily. Qty: 60 tablet, Refills: 1    traZODone (DESYREL) 50 MG tablet Take 50 mg by mouth at bedtime.      STOP taking these medications     ketoconazole (NIZORAL) 2 % shampoo      naproxen (NAPROSYN) 500 MG tablet      REXULTI 2 MG TABS      TACLONEX external suspension      tiZANidine (ZANAFLEX) 2 MG tablet      doxycycline (VIBRA-TABS) 100 MG tablet        Allergies  Allergen Reactions  . Sulfa Antibiotics Shortness Of Breath  . Iohexol Other (See Comments)    Caused hypertension during IVP 1994, USES 13 HR PREP   . Ivp Dye [Iodinated Diagnostic Agents]   . Mobic [Meloxicam]   . Tape Other (See Comments)    Redness        The results of significant diagnostics from this hospitalization (including imaging, microbiology, ancillary and laboratory) are listed below for reference.    Significant Diagnostic Studies:  Imaging Results    Dg Cervical Spine Complete  10/24/2014 CLINICAL DATA: 44 year old male with chronic cervical spine pain radiating into right arm. History of 3  prior cervical spine surgeries. EXAM: CERVICAL SPINE 4+ VIEWS COMPARISON: 06/19/2014 CT, 06/18/2013 MR and 12/28/2012 radiographs. FINDINGS: Anterior plate and screw fixation from C3-C6 with C6-7 interbody prosthesis again noted. There is no evidence of acute fracture, subluxation or prevertebral soft tissue swelling noted. Mild degenerative disc disease and spondylosis at C2-3 again noted. Mild right bony foraminal narrowing at C3-4 appears unchanged from 2014. No focal bony lesions are present. IMPRESSION: No evidence of acute abnormality. Surgical fusion changes from C3-C7 without complicating features. Electronically Signed By: Harmon PierJeffrey Hu M.D. On: 10/24/2014 14:28   Ct Angio Chest Pe W/cm &/or Wo Cm  11/07/2014 CLINICAL DATA: Acute onset of central chest pain and shortness of breath. Diffuse chest tightness, radiating to the  left arm. Initial encounter. EXAM: CT ANGIOGRAPHY CHEST WITH CONTRAST TECHNIQUE: Multidetector CT imaging of the chest was performed using the standard protocol during bolus administration of intravenous contrast. Multiplanar CT image reconstructions and MIPs were obtained to evaluate the vascular anatomy. CONTRAST: OMNIPAQUE IOHEXOL 350 MG/ML SOLN COMPARISON: Chest radiograph performed 11/06/2014 FINDINGS: There is no evidence of pulmonary embolus. Mild patchy bilateral airspace opacities may reflect atelectasis or scarring, more prominent than on the prior study. Associated calcification is noted at the right lung base. There is no evidence of pleural effusion or pneumothorax. No masses are identified; no abnormal focal contrast enhancement is seen. The patient is status post median sternotomy. The mediastinum is unremarkable in appearance. No mediastinal lymphadenopathy is seen. No pericardial effusion is identified. The great vessels are grossly unremarkable in appearance. No axillary lymphadenopathy is seen. The visualized portions of the thyroid gland are  unremarkable in appearance. The visualized portions of the liver and spleen are unremarkable. The visualized portions of the pancreas, gallbladder, stomach, adrenal glands and kidneys are within normal limits. No acute osseous abnormalities are seen. Review of the MIP images confirms the above findings. IMPRESSION: 1. No evidence of pulmonary embolus. 2. Mild patchy bilateral airspace opacities may reflect atelectasis or scarring, more prominent than on the prior study. Associated calcification at the right lung base, stable in appearance. Electronically Signed By: Roanna Raider M.D. On: 11/07/2014 04:52     Microbiology: Recent Results (from the past 240 hour(s))  MRSA PCR Screening Status: Abnormal   Collection Time: 11/07/14 4:40 PM  Result Value Ref Range Status   MRSA by PCR POSITIVE (A) NEGATIVE Final    Comment:   The GeneXpert MRSA Assay (FDA approved for NASAL specimens only), is one component of a comprehensive MRSA colonization surveillance program. It is not intended to diagnose MRSA infection nor to guide or monitor treatment for MRSA infections. RESULT CALLED TO, READ BACK BY AND VERIFIED WITH: JOHNSON,B ON 11/07/14 AT 2050 BY LOY,C      Labs: Basic Metabolic Panel:  Last Labs      Recent Labs Lab 11/07/14 0328 11/08/14 0610  NA 141 138  K 3.4* 3.6  CL 102 99*  CO2 27 30  GLUCOSE 190* 181*  BUN 13 14  CREATININE 0.80 0.80  CALCIUM 9.1 9.0     Liver Function Tests:  Last Labs      Recent Labs Lab 11/07/14 0328  AST 20  ALT 25  ALKPHOS 77  BILITOT 0.9  PROT 8.1  ALBUMIN 4.6      Last Labs      Recent Labs Lab 11/07/14 0328  LIPASE 28     CBC: N Last Labs      Recent Labs Lab 11/07/14 0328 11/08/14 0610  WBC 13.3* 13.0*  NEUTROABS 12.5* --   HGB 14.4 13.2  HCT 43.1 40.5  MCV 88.7 88.6  PLT 169 172     Cardiac Enzymes:  Last  Labs      Recent Labs Lab 11/07/14 0328 11/07/14 0810 11/07/14 1413 11/07/14 1906  TROPONINI <0.03 <0.03 <0.03 <0.03      Recent Labs (within last 365 days)     Recent Labs  11/07/14 0328  BNP 56.0      CBG:  Last Labs      Recent Labs Lab 11/07/14 1619 11/07/14 2146 11/08/14 0739 11/08/14 1119  GLUCAP 225* 204* 200* 210*     Signed:  Yui Mulvaney [facility] 11/08/2014, 11:39 AM  No new event over the past 24 hours.

## 2014-11-09 NOTE — Progress Notes (Signed)
Pt ambulated in hallway. O2 saturation 98% on room air. No complaints of discomfort will continue to monitor patient.

## 2014-11-09 NOTE — Progress Notes (Signed)
Pt's IV catheter removed and intact. Pt's IV site clean dry and intact. Discharge instructions, medications and follow up appointments reviewed and discussed with patient. Pt verbalized understanding of discharge instructions and medications. All questions were answered and no further questions at this time. Pt in stable condition and in no actue distress at this time. Pt will be escorted by nurse tech.

## 2014-11-16 ENCOUNTER — Emergency Department (HOSPITAL_COMMUNITY)
Admission: EM | Admit: 2014-11-16 | Discharge: 2014-11-16 | Disposition: A | Discharge status: Home / Self Care | Payer: Medicare Other | Attending: Emergency Medicine | Admitting: Emergency Medicine

## 2014-11-16 ENCOUNTER — Encounter (HOSPITAL_COMMUNITY): Payer: Self-pay | Admitting: *Deleted

## 2014-11-16 DIAGNOSIS — Z7952 Long term (current) use of systemic steroids: Secondary | ICD-10-CM | POA: Diagnosis not present

## 2014-11-16 DIAGNOSIS — Z9889 Other specified postprocedural states: Secondary | ICD-10-CM | POA: Diagnosis not present

## 2014-11-16 DIAGNOSIS — G8929 Other chronic pain: Secondary | ICD-10-CM | POA: Insufficient documentation

## 2014-11-16 DIAGNOSIS — Z7982 Long term (current) use of aspirin: Secondary | ICD-10-CM | POA: Diagnosis not present

## 2014-11-16 DIAGNOSIS — Z86711 Personal history of pulmonary embolism: Secondary | ICD-10-CM | POA: Insufficient documentation

## 2014-11-16 DIAGNOSIS — Z862 Personal history of diseases of the blood and blood-forming organs and certain disorders involving the immune mechanism: Secondary | ICD-10-CM | POA: Diagnosis not present

## 2014-11-16 DIAGNOSIS — E119 Type 2 diabetes mellitus without complications: Secondary | ICD-10-CM | POA: Diagnosis not present

## 2014-11-16 DIAGNOSIS — E669 Obesity, unspecified: Secondary | ICD-10-CM | POA: Insufficient documentation

## 2014-11-16 DIAGNOSIS — J449 Chronic obstructive pulmonary disease, unspecified: Secondary | ICD-10-CM | POA: Diagnosis not present

## 2014-11-16 DIAGNOSIS — Z79899 Other long term (current) drug therapy: Secondary | ICD-10-CM | POA: Insufficient documentation

## 2014-11-16 DIAGNOSIS — I509 Heart failure, unspecified: Secondary | ICD-10-CM | POA: Diagnosis not present

## 2014-11-16 DIAGNOSIS — G4730 Sleep apnea, unspecified: Secondary | ICD-10-CM | POA: Insufficient documentation

## 2014-11-16 DIAGNOSIS — M542 Cervicalgia: Secondary | ICD-10-CM | POA: Diagnosis present

## 2014-11-16 DIAGNOSIS — K219 Gastro-esophageal reflux disease without esophagitis: Secondary | ICD-10-CM | POA: Diagnosis not present

## 2014-11-16 DIAGNOSIS — M5412 Radiculopathy, cervical region: Secondary | ICD-10-CM | POA: Diagnosis not present

## 2014-11-16 DIAGNOSIS — I2510 Atherosclerotic heart disease of native coronary artery without angina pectoris: Secondary | ICD-10-CM | POA: Diagnosis not present

## 2014-11-16 DIAGNOSIS — I10 Essential (primary) hypertension: Secondary | ICD-10-CM | POA: Diagnosis not present

## 2014-11-16 DIAGNOSIS — Z9981 Dependence on supplemental oxygen: Secondary | ICD-10-CM | POA: Insufficient documentation

## 2014-11-16 DIAGNOSIS — F329 Major depressive disorder, single episode, unspecified: Secondary | ICD-10-CM | POA: Insufficient documentation

## 2014-11-16 MED ORDER — HYDROCODONE-ACETAMINOPHEN 5-325 MG PO TABS
2.0000 | ORAL_TABLET | Freq: Once | ORAL | Status: AC
  Administered 2014-11-16: 2 via ORAL
  Filled 2014-11-16: qty 2

## 2014-11-16 NOTE — ED Notes (Addendum)
Pt c/o neck pain that radiates down right arm, states that he has been having this problem for a while but it became worse over the weekend, unsure of any injury just states "i have been having trouble with my neck" pt states that the pain is worse with movement
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
## 2014-11-16 NOTE — Discharge Instructions (Signed)
Call your pain doctor and call your surgeon.  If you were given medicines take as directed.  If you are on coumadin or contraceptives realize their levels and effectiveness is altered by many different medicines.  If you have any reaction (rash, tongues swelling, other) to the medicines stop taking and see a physician.    If your blood pressure was elevated in the ER make sure you follow up for management with a primary doctor or return for chest pain, shortness of breath or stroke symptoms.  Please follow up as directed and return to the ER or see a physician for new or worsening symptoms.  Thank you. Filed Vitals:   11/16/14 1914  BP: 166/88  Pulse: 99  Temp: 98 F (36.7 C)  TempSrc: Oral  Resp: 24  Height: 5\' 11"  (1.803 m)  Weight: 285 lb (129.275 kg)  SpO2: 100%    Cervical Radiculopathy Cervical radiculopathy means that a nerve in the neck is pinched or bruised. This can cause pain or loss of feeling (numbness) that runs from your neck to your arm and fingers. HOME CARE Managing Pain  Take over-the-counter and prescription medicines only as told by your doctor.  If directed, put ice on the injured or painful area.  Put ice in a plastic bag.  Place a towel between your skin and the bag.  Leave the ice on for 20 minutes, 2-3 times per day.  If ice does not help, you can try using heat. Take a warm shower or warm bath, or use a heat pack as told by your doctor.  You may try a gentle neck and shoulder massage. Activity  Rest as needed. Follow instructions from your doctor about any activities to avoid.  Do exercises as told by your doctor or physical therapist. General Instructions   If you were given a soft collar, wear it as told by your doctor.  Use a flat pillow when you sleep.  Keep all follow-up visits as told by your doctor. This is important. GET HELP IF:  Your condition does not improve with treatment. GET HELP RIGHT AWAY IF:   Your pain gets worse  and is not controlled with medicine.  You lose feeling or feel weak in your hand, arm, face, or leg.  You have a fever.  You have a stiff neck.  You cannot control when you poop or pee (have incontinence).  You have trouble with walking, balance, or talking.   This information is not intended to replace advice given to you by your health care provider. Make sure you discuss any questions you have with your health care provider.   Document Released: 12/16/2010 Document Revised: 09/17/2014 Document Reviewed: 02/20/2014 Elsevier Interactive Patient Education [employer]2016 [employer]

## 2014-11-17 ENCOUNTER — Encounter (HOSPITAL_COMMUNITY): Payer: Self-pay | Admitting: Family Medicine

## 2014-11-17 ENCOUNTER — Emergency Department (HOSPITAL_COMMUNITY)
Admission: EM | Admit: 2014-11-17 | Discharge: 2014-11-17 | Disposition: A | Discharge status: Home / Self Care | Payer: Medicare Other | Attending: Emergency Medicine | Admitting: Emergency Medicine

## 2014-11-17 DIAGNOSIS — I2510 Atherosclerotic heart disease of native coronary artery without angina pectoris: Secondary | ICD-10-CM | POA: Diagnosis not present

## 2014-11-17 DIAGNOSIS — I10 Essential (primary) hypertension: Secondary | ICD-10-CM | POA: Insufficient documentation

## 2014-11-17 DIAGNOSIS — E669 Obesity, unspecified: Secondary | ICD-10-CM | POA: Insufficient documentation

## 2014-11-17 DIAGNOSIS — J449 Chronic obstructive pulmonary disease, unspecified: Secondary | ICD-10-CM | POA: Diagnosis not present

## 2014-11-17 DIAGNOSIS — G8929 Other chronic pain: Secondary | ICD-10-CM | POA: Diagnosis not present

## 2014-11-17 DIAGNOSIS — Z79899 Other long term (current) drug therapy: Secondary | ICD-10-CM | POA: Diagnosis not present

## 2014-11-17 DIAGNOSIS — Z8614 Personal history of Methicillin resistant Staphylococcus aureus infection: Secondary | ICD-10-CM | POA: Diagnosis not present

## 2014-11-17 DIAGNOSIS — I509 Heart failure, unspecified: Secondary | ICD-10-CM | POA: Insufficient documentation

## 2014-11-17 DIAGNOSIS — R52 Pain, unspecified: Secondary | ICD-10-CM | POA: Diagnosis present

## 2014-11-17 DIAGNOSIS — Z9981 Dependence on supplemental oxygen: Secondary | ICD-10-CM | POA: Diagnosis not present

## 2014-11-17 DIAGNOSIS — Z7982 Long term (current) use of aspirin: Secondary | ICD-10-CM | POA: Diagnosis not present

## 2014-11-17 DIAGNOSIS — Z862 Personal history of diseases of the blood and blood-forming organs and certain disorders involving the immune mechanism: Secondary | ICD-10-CM | POA: Diagnosis not present

## 2014-11-17 DIAGNOSIS — G4730 Sleep apnea, unspecified: Secondary | ICD-10-CM | POA: Insufficient documentation

## 2014-11-17 DIAGNOSIS — R350 Frequency of micturition: Secondary | ICD-10-CM | POA: Diagnosis not present

## 2014-11-17 DIAGNOSIS — M542 Cervicalgia: Secondary | ICD-10-CM | POA: Diagnosis not present

## 2014-11-17 DIAGNOSIS — Z86711 Personal history of pulmonary embolism: Secondary | ICD-10-CM | POA: Diagnosis not present

## 2014-11-17 LAB — COMPREHENSIVE METABOLIC PANEL
ALBUMIN: 3.7 g/dL (ref 3.5–5.0)
ALK PHOS: 69 U/L (ref 38–126)
ALT: 34 U/L (ref 17–63)
ANION GAP: 11 (ref 5–15)
AST: 20 U/L (ref 15–41)
BILIRUBIN TOTAL: 0.6 mg/dL (ref 0.3–1.2)
BUN: 16 mg/dL (ref 6–20)
CALCIUM: 8.8 mg/dL — AB (ref 8.9–10.3)
CO2: 30 mmol/L (ref 22–32)
Chloride: 98 mmol/L — ABNORMAL LOW (ref 101–111)
Creatinine, Ser: 1.08 mg/dL (ref 0.61–1.24)
GFR calc Af Amer: >60 mL/min (ref >60)
GLUCOSE: 194 mg/dL — AB (ref 65–99)
Potassium: 3.1 mmol/L — ABNORMAL LOW (ref 3.5–5.1)
Sodium: 139 mmol/L (ref 135–145)
TOTAL PROTEIN: 6.7 g/dL (ref 6.5–8.1)

## 2014-11-17 LAB — URINALYSIS, ROUTINE W REFLEX MICROSCOPIC
Bilirubin Urine: NEGATIVE
Glucose, UA: 100 mg/dL — AB
Hgb urine dipstick: NEGATIVE
Ketones, ur: 15 mg/dL — AB
LEUKOCYTES UA: NEGATIVE
Nitrite: NEGATIVE
PROTEIN: 100 mg/dL — AB
Specific Gravity, Urine: 1.03 (ref 1.005–1.030)
UROBILINOGEN UA: 1 mg/dL (ref 0.0–1.0)
pH: 6 (ref 5.0–8.0)

## 2014-11-17 LAB — URINE MICROSCOPIC-ADD ON

## 2014-11-17 LAB — CBC WITH DIFFERENTIAL/PLATELET
BASOS ABS: 0 10*3/uL (ref 0.0–0.1)
Basophils Relative: 0 %
Eosinophils Absolute: 0.1 10*3/uL (ref 0.0–0.7)
Eosinophils Relative: 1 %
HEMATOCRIT: 44 % (ref 39.0–52.0)
HEMOGLOBIN: 14.5 g/dL (ref 13.0–17.0)
LYMPHS ABS: 1.6 10*3/uL (ref 0.7–4.0)
LYMPHS PCT: 20 %
MCH: 29.4 pg (ref 26.0–34.0)
MCHC: 33 g/dL (ref 30.0–36.0)
MCV: 89.2 fL (ref 78.0–100.0)
Monocytes Absolute: 0.6 10*3/uL (ref 0.1–1.0)
Monocytes Relative: 8 %
NEUTROS ABS: 5.7 10*3/uL (ref 1.7–7.7)
Neutrophils Relative %: 71 %
PLATELETS: 210 10*3/uL (ref 150–400)
RBC: 4.93 MIL/uL (ref 4.22–5.81)
RDW: 14.2 % (ref 11.5–15.5)
WBC: 8.1 10*3/uL (ref 4.0–10.5)

## 2014-11-17 MED ORDER — POTASSIUM CHLORIDE CRYS ER 20 MEQ PO TBCR
40.0000 meq | EXTENDED_RELEASE_TABLET | Freq: Once | ORAL | Status: AC
  Administered 2014-11-17: 40 meq via ORAL
  Filled 2014-11-17: qty 2

## 2014-11-17 MED ORDER — OXYCODONE-ACETAMINOPHEN 5-325 MG PO TABS
2.0000 | ORAL_TABLET | Freq: Once | ORAL | Status: AC
  Administered 2014-11-17: 2 via ORAL
  Filled 2014-11-17: qty 2

## 2014-11-17 NOTE — Discharge Instructions (Signed)
Chronic Pain °Chronic pain can be defined as pain that is off and on and lasts for 3-6 months or longer. Many things cause chronic pain, which can make it difficult to make a diagnosis. There are many treatment options available for chronic pain. However, finding a treatment that works well for you may require trying various approaches until the right one is found. Many people benefit from a combination of two or more types of treatment to control their pain. °SYMPTOMS  °Chronic pain can occur anywhere in the body and can range from mild to very severe. Some types of chronic pain include: °· Headache. °· Low back pain. °· Cancer pain. °· Arthritis pain. °· Neurogenic pain. This is pain resulting from damage to nerves. ° People with chronic pain may also have other symptoms such as: °· Depression. °· Anger. °· Insomnia. °· Anxiety. °DIAGNOSIS  °Your health care provider will help diagnose your condition over time. In many cases, the initial focus will be on excluding possible conditions that could be causing the pain. Depending on your symptoms, your health care provider may order tests to diagnose your condition. Some of these tests may include:  °· Blood tests.   °· CT scan.   °· MRI.   °· X-rays.   °· Ultrasounds.   °· Nerve conduction studies.   °You may need to see a specialist.  °TREATMENT  °Finding treatment that works well may take time. You may be referred to a pain specialist. He or she may prescribe medicine or therapies, such as:  °· Mindful meditation or yoga. °· Shots (injections) of numbing or pain-relieving medicines into the spine or area of pain. °· Local electrical stimulation. °· Acupuncture.   °· Massage therapy.   °· Aroma, color, light, or sound therapy.   °· Biofeedback.   °· Working with a physical therapist to keep from getting stiff.   °· Regular, gentle exercise.   °· Cognitive or behavioral therapy.   °· Group support.   °Sometimes, surgery may be recommended.  °HOME CARE INSTRUCTIONS   °· Take all medicines as directed by your health care provider.   °· Lessen stress in your life by relaxing and doing things such as listening to calming music.   °· Exercise or be active as directed by your health care provider.   °· Eat a healthy diet and include things such as vegetables, fruits, fish, and lean meats in your diet.   °· Keep all follow-up appointments with your health care provider.   °· Attend a support group with others suffering from chronic pain. °SEEK MEDICAL CARE IF:  °· Your pain gets worse.   °· You develop a new pain that was not there before.   °· You cannot tolerate medicines given to you by your health care provider.   °· You have new symptoms since your last visit with your health care provider.   °SEEK IMMEDIATE MEDICAL CARE IF:  °· You feel weak.   °· You have decreased sensation or numbness.   °· You lose control of bowel or bladder function.   °· Your pain suddenly gets much worse.   °· You develop shaking. °· You develop chills. °· You develop confusion. °· You develop chest pain. °· You develop shortness of breath.   °MAKE SURE YOU: °· Understand these instructions. °· Will watch your condition. °· Will get help right away if you are not doing well or get worse. °  °This information is not intended to replace advice given to you by your health care provider. Make sure you discuss any questions you have with your health care provider. °  °Document Released: 09/18/2001   Document Revised: 08/29/2012 Document Reviewed: 06/22/2012 °Elsevier Interactive Patient Education ©2016 Elsevier Inc. ° ° °Emergency Department Resource Guide °1) Find a Doctor and Pay Out of Pocket °Although you won't have to find out who is covered by your insurance plan, it is a good idea to ask around and get recommendations. You will then need to call the office and see if the doctor you have chosen will accept you as a new patient and what types of options they offer for patients who are self-pay. Some  doctors offer discounts or will set up payment plans for their patients who do not have insurance, but you will need to ask so you aren't surprised when you get to your appointment. ° °2) Contact Your Local Health Department °Not all health departments have doctors that can see patients for sick visits, but many do, so it is worth a call to see if yours does. If you don't know where your local health department is, you can check in your phone book. The CDC also has a tool to help you locate your state's health department, and many state websites also have listings of all of their local health departments. ° °3) Find a Walk-in Clinic °If your illness is not likely to be very severe or complicated, you may want to try a walk in clinic. These are popping up all over the country in pharmacies, drugstores, and shopping centers. They're usually staffed by nurse practitioners or physician assistants that have been trained to treat common illnesses and complaints. They're usually fairly quick and inexpensive. However, if you have serious medical issues or chronic medical problems, these are probably not your best option. ° °No Primary Care Doctor: °- Call [facility] at  832-8000 - they can help you locate a primary care doctor that  accepts your insurance, provides certain services, etc. °- Physician Referral Service- 1-800-533-3463 ° °Chronic Pain Problems: °Organization         Address  Phone   Notes  °[facility]  (336) 297-2271 Patients need to be referred by their primary care doctor.  ° °Medication Assistance: °Organization         Address  Phone   Notes  °[place] [facility] [address] °[place], [place] [address] (336) 641-8030 --Must be a resident of [place] °-- Must have NO insurance coverage whatsoever (no Medicaid/ Medicare, etc.) °-- The pt. MUST have a primary care doctor that directs their care regularly and follows them in the  community °  °[facility]  (866) 331-1348   °[employer]  (888) 892-1162   ° °Agencies that provide inexpensive medical care: °Organization         Address  Phone   Notes  °[facility]  (336) 832-8035   ° Internal Medicine    (336) 832-7272   °[facility] [address] °[place], [place] [address] (336) 832-4777   °[facility] [address], °[place] (336) 271-4999   °[facility]    (336) 373-0678   °[facility]    (336) 272-1050   °[facility] ° [address], [place] Phone:  (336) 832-4444, Fax:  (336) 832-4440 Hours of Operation:  9 am - 6 pm, M-F.  Also accepts Medicaid/Medicare and self-pay.  °[facility] ° [address], [place] Phone: (336) 832-3150, Fax: (336) 832-3151. Hours of Operation:  8:30   am - 5:30 pm, M-F.  Also accepts Medicaid and self-pay.  °[facility] [place] [address], [place] Phone: (336) 878-6027   °[facility] [address], [place], [place] (336)723-1848, Ext. 123 Mondays & Thursdays: 7-9 AM.  First 15 patients are seen on a first come, first serve basis. °  ° °Medicaid-accepting [place] Providers: ° °Organization         Address  Phone   Notes  °[facility] [address], [place] (336) 641-2100 Also accepts self-pay patients.  °[facility] [address], [place] ° (336) 856-9996   °[facility] [address], [place] (336) 288-8857   °[facility] [address] [place] [address], [place] (336) 299-7000   °Veita Bland [address], [place]  ° (336) 373-1557 Only accepts [place] Access Medicaid patients after they have their name applied to their card.  ° °Self-Pay (no insurance) in [place]: ° °Organization         Address  Phone   Notes  °[facility] [address], [place] (336) 832-1970   °[facility] [address], [place] (336) 832-4400   °[facility] ° [address], [place] (336) 992-4800   °[facility]/Dr. Osei-Bonsu ° [address] [place] [address], [place] or [address], [place] (336) 841-8500 Phone number for both [place] and [place] locations is the same.  °[facility] [address], [place] (336) 299-0000   °[facility] [address] [place] [address], [place] or [address] (336) 852-7530 °(336) 878-2260   °[facility] [address], [place] (336) 350-1642, phone; (336) 294-5005, fax Sees patients 1st and 3rd Saturday of every month.  Must not qualify for public or private insurance (i.e. Medicaid, Medicare, [place] Health Choice, Veterans' Benefits) • Household income should be no more than 200% of the poverty level •The clinic cannot treat you if you are pregnant or think you are pregnant • Sexually transmitted diseases are not treated at the clinic.  ° ° °Dental Care: °Organization         Address  Phone  Notes  °[facility] [address], [place] (336) 641-6152 Accepts children up to age 21 who are enrolled in Medicaid or [place] Health Choice; pregnant women with a Medicaid card; and children who have applied for Medicaid or [place] Health Choice, but were declined, whose parents can pay a reduced fee at time of service.  °[facility] [place]  [address], [place] (336) 641-7733 Accepts children up to age 21 who are enrolled in Medicaid or [place] Health Choice; pregnant women with a Medicaid card; and children who have applied for Medicaid or  Health Choice, but were declined, whose parents can pay a reduced fee at time of service.  °[facility] ° [address],  (336) 641-4533 Patients are seen by appointment only. Walk-ins are not accepted. [facility] will see patients 18 years of age and older. °Monday - Tuesday (8am-5pm) °Most Wednesdays (8:30-5pm) °$30 per visit, cash only  °[facility] ° [address], [place] (336) 641-4533 Patients are seen by appointment only.   Walk-ins are not accepted. [facility] will see patients 18 years of age and older. °One Wednesday Evening (Monthly: Volunteer Based).  $30 per visit, cash only  °[facility]  (919) 537-3737 for adults; Children under age 4, call Graduate Pediatric Dentistry at ([phone]) [phone]. Children aged 4-14, please call (919) [phone] to request a pediatric application. ° Dental services are provided in all areas of dental care including fillings, crowns and bridges, complete and partial dentures, implants, gum treatment, root canals, and extractions. Preventive care is also provided. Treatment is provided to both adults and children. °Patients are selected via a lottery and there is often a waiting list. °  °[facility] [address], °[place] ° (336) 763-8833 [email or web address] °  °[facility] [address], [place], [place] (336)723-1848, Ext. 123 Second and Fourth Thursday of each month, opens at 6:30 AM; Clinic ends at 9 AM.  Patients are seen on a first-come first-served basis, and a limited number are seen during each clinic.  ° °[facility] ° [address], [place], [place] (336) 723-7904   Eligibility Requirements °You must have lived in [place], [place], or [place] counties for at least the last three months. °  You cannot be eligible for state or federal sponsored healthcare insurance, including Veterans Administration, Medicaid, or Medicare. °  You generally cannot be eligible for healthcare insurance through your employer.  °  How to apply: °Eligibility screenings are held every  Tuesday and Wednesday afternoon from 1:00 pm until 4:00 pm. You do not need an appointment for the interview!  °[facility] [address], [place], [place] 336-631-2330   °[facility]  336-342-8273   °[facility]  336-703-3100   °[facility]  336-570-6415   ° °Behavioral Health Resources in the Community: °Intensive Outpatient Programs °Organization         Address  Phone  Notes  °[facility] [address], [place], [place] 336-878-6098   °[facility] [address], [place], [place] 336-832-9800   °[facility] [address], [place], [place] ° 336-882-2125   °[facility] [address],  °[place], [place] 1-800-853-5163 or 336-641-4981   °Substance Abuse Resources °Organization         Address  Phone  Notes  °[facility]  336-882-2125   °[facility]  336-784-9470   °[facility]  336-285-9073   °[facility]  336-845-3988   °Residential & Outpatient Substance Abuse Program  1-800-659-3381   °Psychological Services °Organization         Address  Phone  Notes  °[facility]  336- 832-9600   °[facility]  336- 378-7881   °[facility] [address], [place] 1-800-853-5163 or 336-641-4981   ° °Mobile Crisis Teams °Organization         Address  Phone  Notes  °[facility], Mobile Crisis Care Unit  1-877-626-1772   °Assertive °[facility] ° [address] [place], [place] 336-834-9664   °Sharon DeEsch [address] °[place] 336-554-5454   ° °Self-Help/Support Groups °Organization         Address  Phone             Notes  °[facility] [place] - variety of support groups  336- 373-1402 Call for more information  °Narcotics Anonymous (  NA), [facility] [address], °[place]  2 meetings at this location   ° °Residential Treatment Programs °Organization         Address  Phone  Notes  °[facility] [address],    °[place]  1-866-801-8205   °[facility] ° [address], [place], [place] 704-293-8524   °[facility] [address], [place] 336-845-3988 Admissions: 8am-3pm M-F  °[facility] [address]    °[place], [place] 336-841-1104   °[facility] [address], [place], [place] 336-379-7146   °[facility] [address]  °[place], [place] 336-285-9073   °[facility] - Intensive Outpatient [address], [place], [place] 336-852-3033   °[facility] [address]  °[place], [place] 1-877-615-2722 or 336-784-9470   °[facility] ([facility] [address] , [place] 336-227-7417 Accepts Medicaid  °[facility] [address]  °[place] 1-800-659-3381 Substance Abuse/Addiction Treatment  ° °[place] Behavioral Health Resources °Organization         Address  Phone  Notes  °[facility]  (888) 581-9988   °Julie Brannon, PhD [address] [place], [place]   (336) 349-5553 or (336) 951-0000   °[facility]   [address] °[place], [place] (336) 349-4454   °[facility] [address], [place], [place] (336) 342-8316 Insurance/Medicaid/sponsorship through [facility]  °[facility] [address]                                    [place], [place] (336) 342-8316 Therapy/tele-psych/case  °[facility] [address]  ° [place], [place] (336) 349-2233    °Dr. Arfeen  (336) 349-4544   °[facility] [place]  [employer] [place] [facility] 1) [address], [place] °2) [address], [place] °3)  [address], [place] (336) 349-3220 °(336) 342-7768 ° °(336) 342-8140   °[place] [facility] (336) 342-1394 or (336) 342-3537 (After  Hours)    ° ° ° °

## 2014-11-17 NOTE — ED Notes (Signed)
In with Clydie BraunKaren, [place] at this time.
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
## 2014-11-17 NOTE — ED Notes (Signed)
Pt here for body aches and sts "I cant hold my water". St she is suppose to see a Careers advisersurgeon about his neck.

## 2014-11-17 NOTE — ED Notes (Signed)
Bladder scan showed max at .

## 2014-11-24 ENCOUNTER — Other Ambulatory Visit: Payer: Self-pay | Admitting: Orthopaedic Surgery

## 2014-11-24 DIAGNOSIS — M961 Postlaminectomy syndrome, not elsewhere classified: Secondary | ICD-10-CM

## 2014-12-08 ENCOUNTER — Other Ambulatory Visit: Payer: Self-pay

## 2014-12-08 ENCOUNTER — Ambulatory Visit
Admission: RE | Admit: 2014-12-08 | Discharge: 2014-12-08 | Disposition: A | Discharge status: Home / Self Care | Payer: Medicare Other | Source: Ambulatory Visit | Attending: Orthopaedic Surgery | Admitting: Orthopaedic Surgery

## 2014-12-08 VITALS — BP 198/110 | HR 81

## 2014-12-08 DIAGNOSIS — M5030 Other cervical disc degeneration, unspecified cervical region: Secondary | ICD-10-CM

## 2014-12-08 DIAGNOSIS — M961 Postlaminectomy syndrome, not elsewhere classified: Secondary | ICD-10-CM

## 2014-12-08 DIAGNOSIS — M542 Cervicalgia: Secondary | ICD-10-CM

## 2014-12-08 DIAGNOSIS — G959 Disease of spinal cord, unspecified: Secondary | ICD-10-CM

## 2014-12-08 MED ORDER — DIAZEPAM 5 MG PO TABS
10.0000 mg | ORAL_TABLET | Freq: Once | ORAL | Status: AC
Start: 2014-12-08 — End: 2014-12-08
  Administered 2014-12-08: 10 mg via ORAL

## 2014-12-08 MED ORDER — HYDROMORPHONE HCL 2 MG/ML IJ SOLN
1.5000 mg | Freq: Once | INTRAMUSCULAR | Status: AC
  Administered 2014-12-08: 1.5 mg via INTRAMUSCULAR

## 2014-12-08 MED ORDER — IOHEXOL 300 MG/ML  SOLN
10.0000 mL | Freq: Once | INTRAMUSCULAR | Status: AC | PRN
Start: 2014-12-08 — End: 2014-12-08
  Administered 2014-12-08: 10 mL via INTRATHECAL

## 2014-12-08 MED ORDER — HYDROXYZINE HCL 50 MG/ML IM SOLN
25.0000 mg | Freq: Once | INTRAMUSCULAR | Status: AC
  Administered 2014-12-08: 25 mg via INTRAMUSCULAR

## 2014-12-08 NOTE — Progress Notes (Signed)
Patient states he has been off Amitriptyline, Cymbalta and Trazodone for at least the past two days.  jkl

## 2014-12-08 NOTE — Discharge Instructions (Signed)
Myelogram Discharge Instructions  1. Go home and rest quietly for the next 24 hours.  It is important to lie flat for the next 24 hours.  Get up only to go to the restroom.  You may lie in the bed or on a couch on your back, your stomach, your left side or your right side.  You may have one pillow under your head.  You may have pillows between your knees while you are on your side or under your knees while you are on your back.  2. DO NOT drive today.  Recline the seat as far back as it will go, while still wearing your seat belt, on the way home.  3. You may get up to go to the bathroom as needed.  You may sit up for 10 minutes to eat.  You may resume your normal diet and medications unless otherwise indicated.  Drink plenty of extra fluids today and tomorrow.  4. The incidence of a spinal headache with nausea and/or vomiting is about 5% (one in 20 patients).  If you develop a headache, lie flat and drink plenty of fluids until the headache goes away.  Caffeinated beverages may be helpful.  If you develop severe nausea and vomiting or a headache that does not go away with flat bed rest, call [phone].  5. You may resume normal activities after your 24 hours of bed rest is over; however, do not exert yourself strongly or do any heavy lifting tomorrow.  6. Call your physician for a follow-up appointment.   You may resume Amitriptyline, Cymbalta and Trazodone on Tuesday, December 09, 2014 after 9:30a.m.

## 2016-04-13 ENCOUNTER — Encounter: Payer: Self-pay | Admitting: *Deleted

## 2016-04-13 ENCOUNTER — Telehealth: Payer: Self-pay

## 2016-04-13 NOTE — Telephone Encounter (Signed)
Call in to DOD, Ladona Ridgel on 04/12/2016, from Dr. Garner Nash office in [place]. Pt needs Cardiology appt soon. Appt made with Dr. Purvis Sheffield on 4/5  in [place].   LM on pt cell phone to call back for details on upcoming appt.

## 2016-04-13 NOTE — Telephone Encounter (Signed)
Spoke with pt he is aware of appt tomorrow, 4/5 in [place], no questions at this time.

## 2016-04-14 ENCOUNTER — Telehealth: Payer: Self-pay | Admitting: Cardiovascular Disease

## 2016-04-14 ENCOUNTER — Encounter: Payer: Self-pay | Admitting: Cardiovascular Disease

## 2016-04-14 ENCOUNTER — Ambulatory Visit (INDEPENDENT_AMBULATORY_CARE_PROVIDER_SITE_OTHER): Payer: Medicare PPO | Admitting: Cardiovascular Disease

## 2016-04-14 ENCOUNTER — Encounter: Payer: Self-pay | Admitting: *Deleted

## 2016-04-14 VITALS — BP 122/78 | HR 73 | Ht 71.0 in | Wt 266.0 lb

## 2016-04-14 DIAGNOSIS — I209 Angina pectoris, unspecified: Secondary | ICD-10-CM

## 2016-04-14 DIAGNOSIS — I255 Ischemic cardiomyopathy: Secondary | ICD-10-CM

## 2016-04-14 DIAGNOSIS — I519 Heart disease, unspecified: Secondary | ICD-10-CM

## 2016-04-14 DIAGNOSIS — I5189 Other ill-defined heart diseases: Secondary | ICD-10-CM

## 2016-04-14 DIAGNOSIS — Z9289 Personal history of other medical treatment: Secondary | ICD-10-CM | POA: Diagnosis not present

## 2016-04-14 DIAGNOSIS — I10 Essential (primary) hypertension: Secondary | ICD-10-CM | POA: Diagnosis not present

## 2016-04-14 DIAGNOSIS — I110 Hypertensive heart disease with heart failure: Secondary | ICD-10-CM

## 2016-04-14 DIAGNOSIS — Z720 Tobacco use: Secondary | ICD-10-CM

## 2016-04-14 DIAGNOSIS — N183 Chronic kidney disease, stage 3 unspecified: Secondary | ICD-10-CM

## 2016-04-14 DIAGNOSIS — I5022 Chronic systolic (congestive) heart failure: Secondary | ICD-10-CM

## 2016-04-14 MED ORDER — METOPROLOL TARTRATE 25 MG PO TABS: 25.0000 mg | ORAL_TABLET | Freq: Two times a day (BID) | ORAL | 6 refills | Status: AC

## 2016-04-14 MED ORDER — NITROGLYCERIN 0.4 MG SL SUBL: 0.4000 mg | SUBLINGUAL_TABLET | SUBLINGUAL | 3 refills | Status: AC | PRN

## 2016-04-14 NOTE — Telephone Encounter (Signed)
Pre-cert Verification for the following procedure   Echo with contrast scheduled for 04/20/16 at [facility]

## 2016-04-14 NOTE — Patient Instructions (Signed)
Medication Instructions:   Begin Metoprolol tart  twice a day.  Begin Nitroglycerin 0.4mg  as needed for severe chest pain only  Continue all other medications.    Labwork: none  Testing/Procedures:  Your physician has requested that you have an echocardiogram. Echocardiography is a painless test that uses sound waves to create images of your heart. It provides your doctor with information about the size and shape of your heart and how well your heart's chambers and valves are working. This procedure takes approximately one hour. There are no restrictions for this procedure (with contrast).  Office will contact with results via phone or letter.    Follow-Up: 3 weeks   Any Other Special Instructions Will Be Listed Below (If Applicable).  If you need a refill on your cardiac medications before your next appointment, please call your pharmacy.
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
## 2016-04-20 ENCOUNTER — Ambulatory Visit (HOSPITAL_COMMUNITY): Admission: RE | Admit: 2016-04-20 | Payer: Medicare PPO | Source: Ambulatory Visit

## 2016-05-02 ENCOUNTER — Ambulatory Visit (HOSPITAL_COMMUNITY): Admission: RE | Admit: 2016-05-02 | Payer: Medicare PPO | Source: Ambulatory Visit

## 2016-05-19 ENCOUNTER — Ambulatory Visit: Payer: Medicare PPO | Admitting: Cardiovascular Disease

## 2017-03-11 IMAGING — DX DG CERVICAL SPINE COMPLETE 4+V
6 series · 6 of 6 positions shown · non-contrast
Comparison: 06/19/2014 CT, 06/18/2013 MR and 12/28/2012
radiographs.

CLINICAL DATA: 43-year-old male with chronic cervical spine pain
radiating into right arm. History of 3 prior cervical spine
surgeries.

EXAM:
CERVICAL SPINE  4+ VIEWS

[c-spine lat]
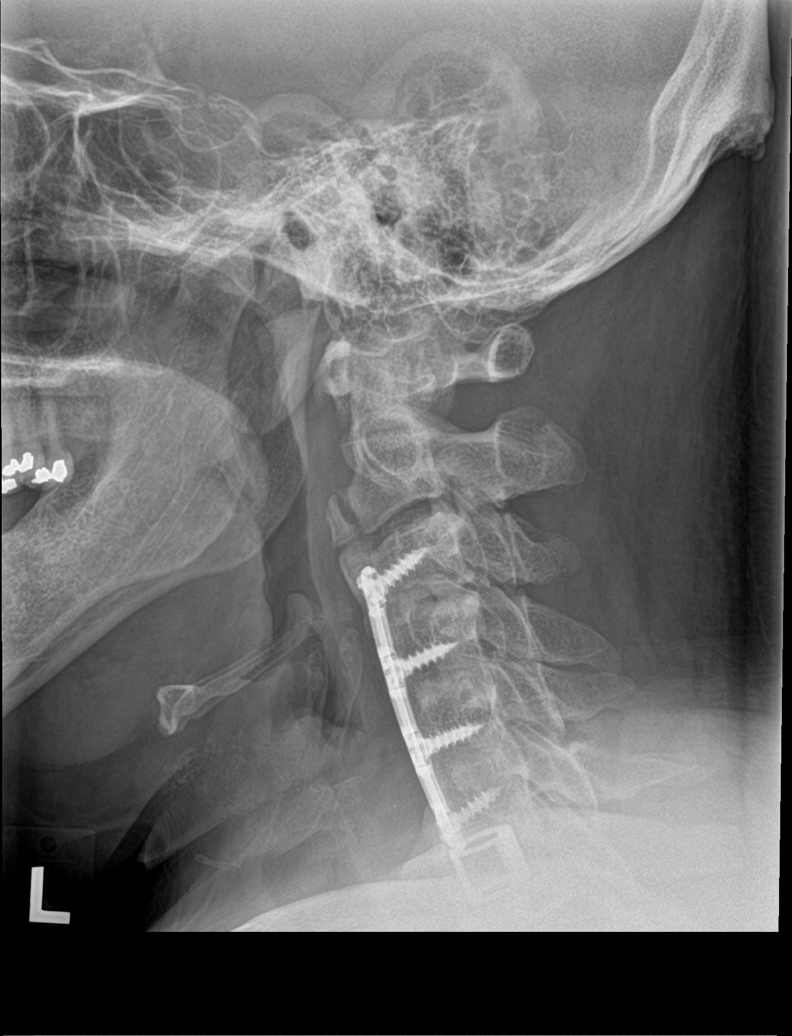

[c-spine obl (1 of 2)]
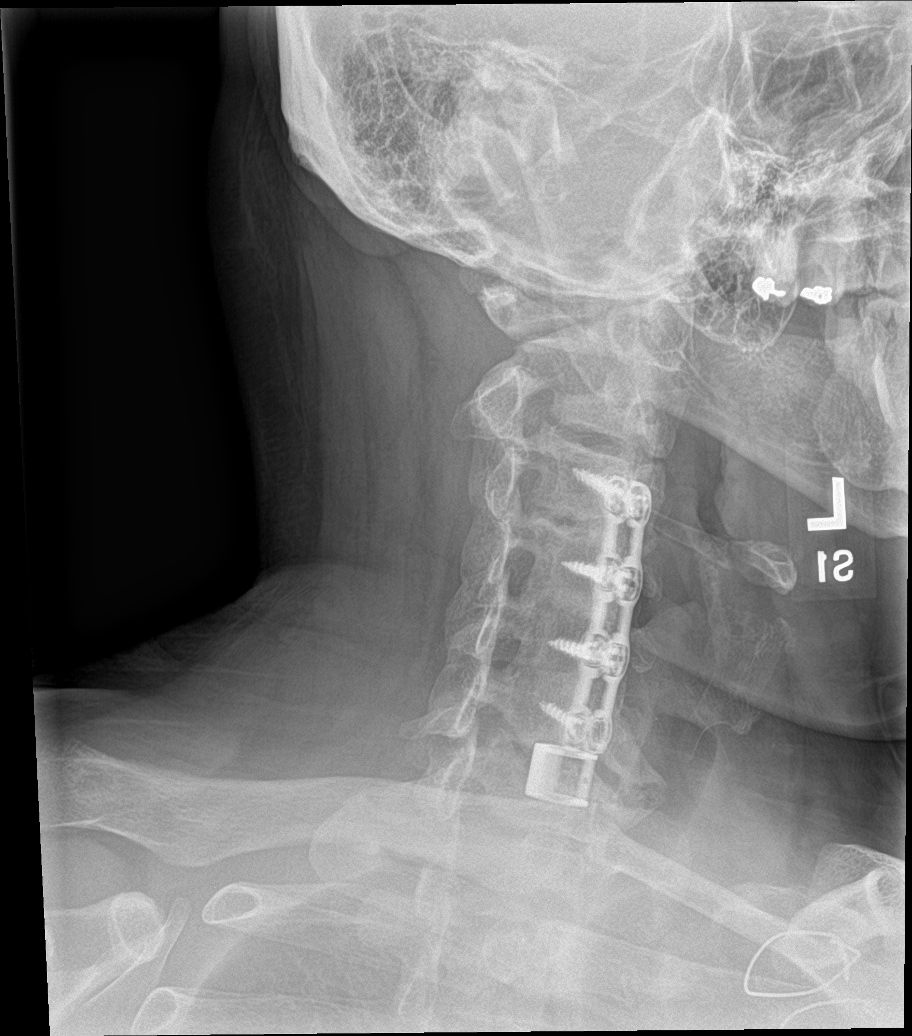

[c-spine obl (2 of 2)]
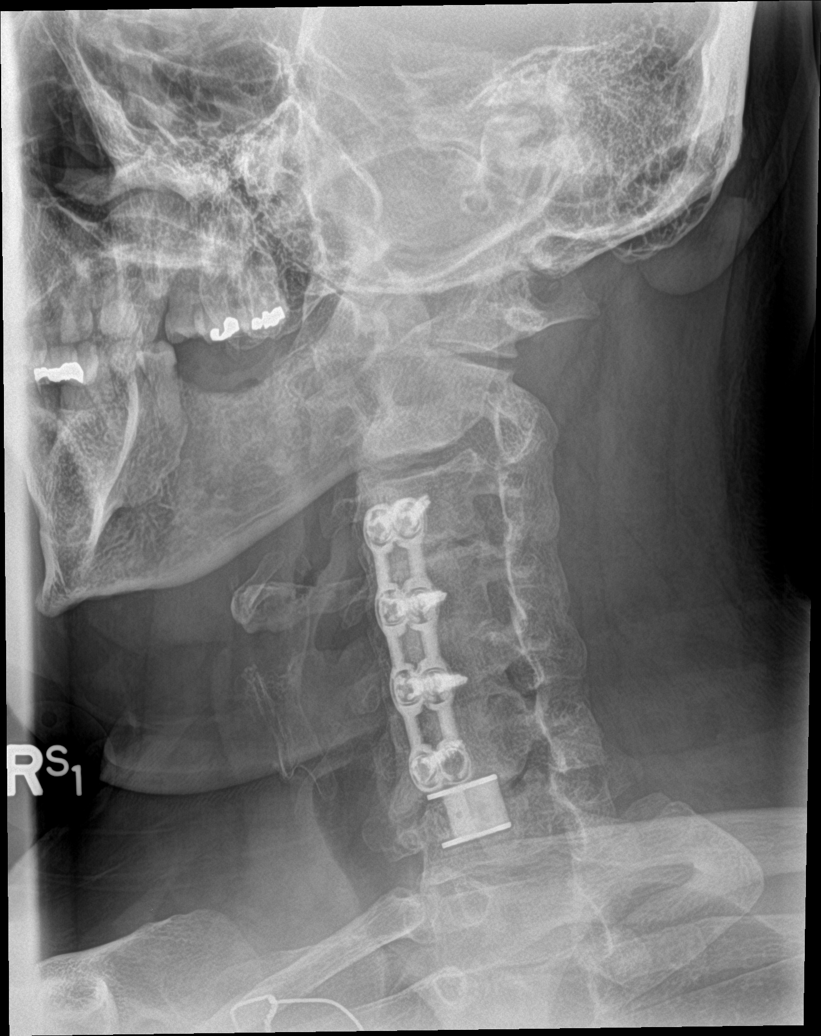

[c-spine ap]
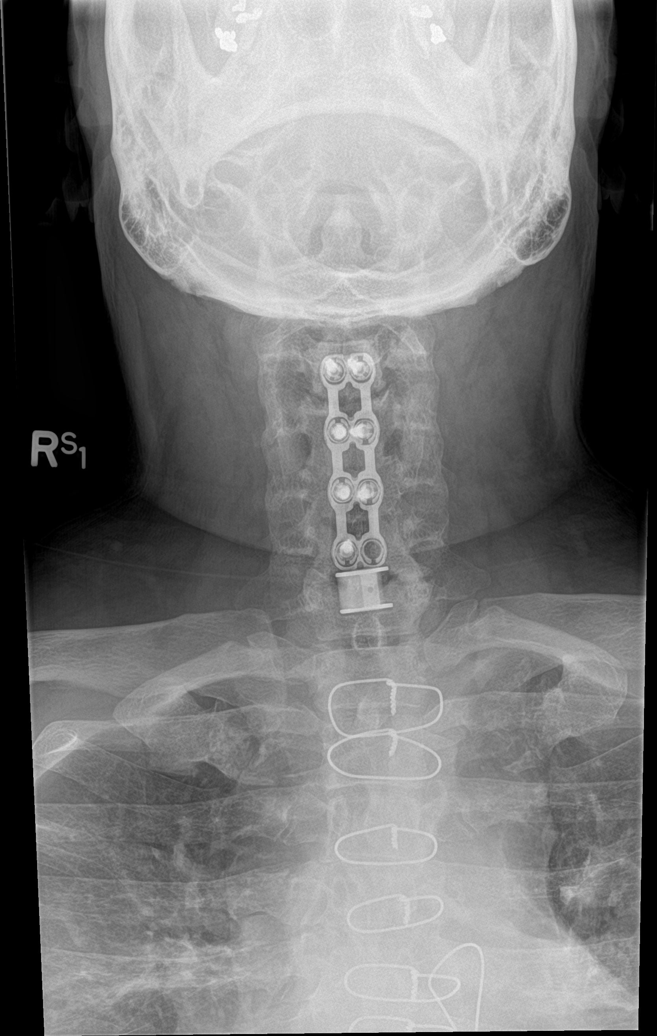

[c-spine open mouth]
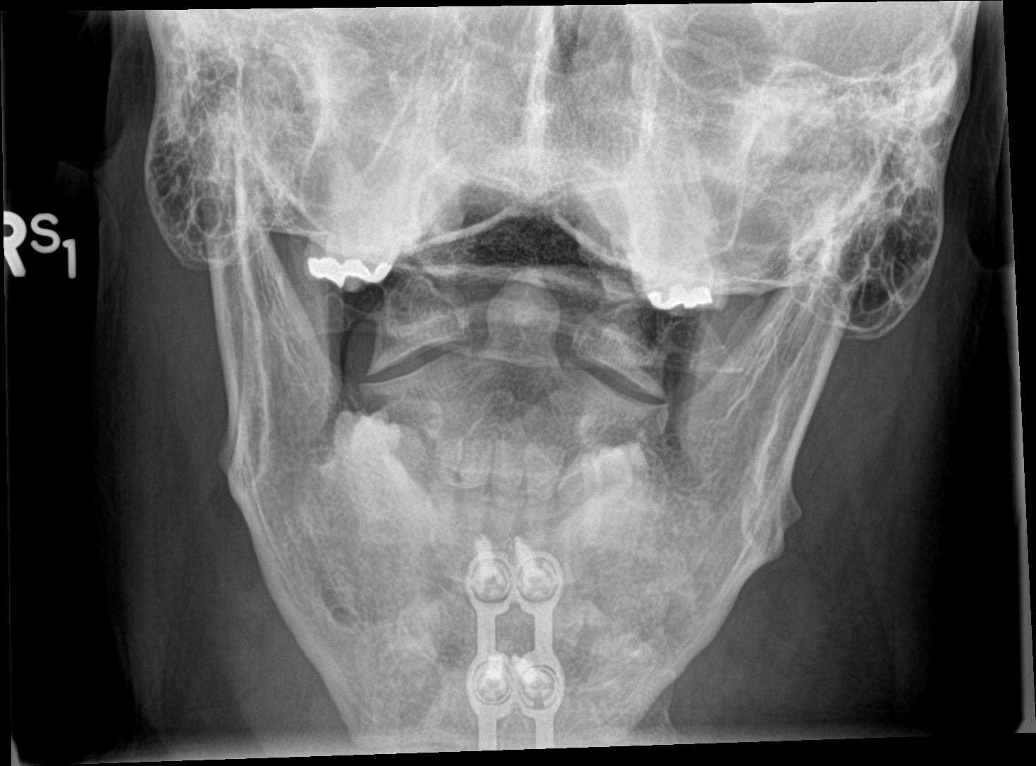

[c-spine swimmers]
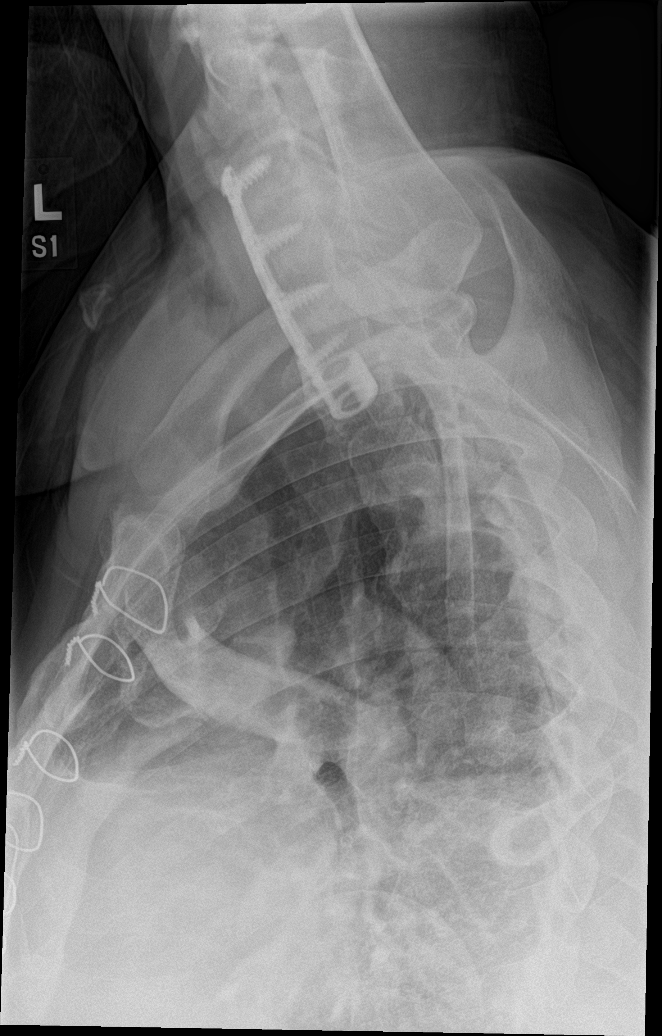

[6 of 6 positions shown; findings below may reference images not displayed]

FINDINGS: Anterior plate and screw fixation from C3-C6 with C6-7 interbody
prosthesis again noted. There is no evidence of acute fracture,
subluxation or prevertebral soft tissue swelling noted.

Mild degenerative disc disease and spondylosis at C2-3 again noted.

Mild right bony foraminal narrowing at C3-4 appears unchanged from
4514.

No focal bony lesions are present.
IMPRESSION: No evidence of acute abnormality.

Surgical fusion changes from C3-C7 without complicating features.

## 2017-03-23 ENCOUNTER — Encounter (HOSPITAL_COMMUNITY): Payer: Self-pay | Admitting: Emergency Medicine

## 2017-03-23 ENCOUNTER — Emergency Department (HOSPITAL_COMMUNITY)
Admission: EM | Admit: 2017-03-23 | Discharge: 2017-03-24 | Disposition: A | Discharge status: Home / Self Care | Payer: Medicare PPO | Attending: Emergency Medicine | Admitting: Emergency Medicine

## 2017-03-23 ENCOUNTER — Emergency Department (HOSPITAL_COMMUNITY)
Admission: EM | Admit: 2017-03-23 | Discharge: 2017-03-23 | Disposition: A | Discharge status: Home / Self Care | Payer: Medicare PPO | Source: Home / Self Care | Attending: Emergency Medicine | Admitting: Emergency Medicine

## 2017-03-23 ENCOUNTER — Other Ambulatory Visit: Payer: Self-pay

## 2017-03-23 DIAGNOSIS — Z79899 Other long term (current) drug therapy: Secondary | ICD-10-CM

## 2017-03-23 DIAGNOSIS — F329 Major depressive disorder, single episode, unspecified: Secondary | ICD-10-CM

## 2017-03-23 DIAGNOSIS — I110 Hypertensive heart disease with heart failure: Secondary | ICD-10-CM

## 2017-03-23 DIAGNOSIS — M542 Cervicalgia: Secondary | ICD-10-CM | POA: Diagnosis not present

## 2017-03-23 DIAGNOSIS — E119 Type 2 diabetes mellitus without complications: Secondary | ICD-10-CM

## 2017-03-23 DIAGNOSIS — M5441 Lumbago with sciatica, right side: Secondary | ICD-10-CM | POA: Insufficient documentation

## 2017-03-23 DIAGNOSIS — G894 Chronic pain syndrome: Secondary | ICD-10-CM | POA: Diagnosis not present

## 2017-03-23 DIAGNOSIS — I509 Heart failure, unspecified: Secondary | ICD-10-CM | POA: Diagnosis not present

## 2017-03-23 DIAGNOSIS — F17210 Nicotine dependence, cigarettes, uncomplicated: Secondary | ICD-10-CM | POA: Insufficient documentation

## 2017-03-23 DIAGNOSIS — J449 Chronic obstructive pulmonary disease, unspecified: Secondary | ICD-10-CM | POA: Insufficient documentation

## 2017-03-23 DIAGNOSIS — R45851 Suicidal ideations: Secondary | ICD-10-CM

## 2017-03-23 DIAGNOSIS — R339 Retention of urine, unspecified: Secondary | ICD-10-CM | POA: Insufficient documentation

## 2017-03-23 DIAGNOSIS — I2510 Atherosclerotic heart disease of native coronary artery without angina pectoris: Secondary | ICD-10-CM | POA: Insufficient documentation

## 2017-03-23 DIAGNOSIS — F32A Depression, unspecified: Secondary | ICD-10-CM

## 2017-03-23 DIAGNOSIS — I5032 Chronic diastolic (congestive) heart failure: Secondary | ICD-10-CM

## 2017-03-23 DIAGNOSIS — M545 Low back pain: Secondary | ICD-10-CM | POA: Diagnosis present

## 2017-03-23 LAB — COMPREHENSIVE METABOLIC PANEL
ALT: 39 U/L (ref 17–63)
ANION GAP: 9 (ref 5–15)
AST: 22 U/L (ref 15–41)
Albumin: 3.8 g/dL (ref 3.5–5.0)
Alkaline Phosphatase: 65 U/L (ref 38–126)
BILIRUBIN TOTAL: 0.8 mg/dL (ref 0.3–1.2)
BUN: 27 mg/dL — AB (ref 6–20)
CHLORIDE: 102 mmol/L (ref 101–111)
CO2: 27 mmol/L (ref 22–32)
Calcium: 8.8 mg/dL — ABNORMAL LOW (ref 8.9–10.3)
Creatinine, Ser: 0.78 mg/dL (ref 0.61–1.24)
GFR calc Af Amer: >60 mL/min (ref >60)
GFR calc non Af Amer: >60 mL/min (ref >60)
Glucose, Bld: 116 mg/dL — ABNORMAL HIGH (ref 65–99)
POTASSIUM: 3.9 mmol/L (ref 3.5–5.1)
Sodium: 138 mmol/L (ref 135–145)
Total Protein: 6.9 g/dL (ref 6.5–8.1)

## 2017-03-23 LAB — CBC
HEMATOCRIT: 39.7 % (ref 39.0–52.0)
HEMOGLOBIN: 12.8 g/dL — AB (ref 13.0–17.0)
MCH: 28.4 pg (ref 26.0–34.0)
MCHC: 32.2 g/dL (ref 30.0–36.0)
MCV: 88 fL (ref 78.0–100.0)
Platelets: 132 10*3/uL — ABNORMAL LOW (ref 150–400)
RBC: 4.51 MIL/uL (ref 4.22–5.81)
RDW: 15.1 % (ref 11.5–15.5)
WBC: 5.1 10*3/uL (ref 4.0–10.5)

## 2017-03-23 LAB — ETHANOL: Alcohol, Ethyl (B): <10 mg/dL (ref <10)

## 2017-03-23 LAB — ACETAMINOPHEN LEVEL

## 2017-03-23 LAB — SALICYLATE LEVEL: Salicylate Lvl: <7 mg/dL (ref 2.8–30.0)

## 2017-03-23 MED ORDER — LEVOCETIRIZINE DIHYDROCHLORIDE 5 MG PO TABS: 5.0000 mg | ORAL_TABLET | Freq: Every day | ORAL | Status: DC

## 2017-03-23 MED ORDER — CELECOXIB 200 MG PO CAPS: 200.0000 mg | ORAL_CAPSULE | Freq: Two times a day (BID) | ORAL | Status: DC

## 2017-03-23 MED ORDER — COLCHICINE 0.6 MG PO TABS: 0.6000 mg | ORAL_TABLET | Freq: Two times a day (BID) | ORAL | Status: DC

## 2017-03-23 MED ORDER — LINACLOTIDE 145 MCG PO CAPS: 145.0000 ug | ORAL_CAPSULE | Freq: Every day | ORAL | Status: DC

## 2017-03-23 MED ORDER — PREGABALIN 50 MG PO CAPS
100.0000 mg | ORAL_CAPSULE | Freq: Two times a day (BID) | ORAL | Status: DC
  Administered 2017-03-23: 100 mg via ORAL
  Filled 2017-03-23: qty 2

## 2017-03-23 MED ORDER — POTASSIUM CHLORIDE CRYS ER 20 MEQ PO TBCR
20.0000 meq | EXTENDED_RELEASE_TABLET | Freq: Two times a day (BID) | ORAL | Status: DC
  Administered 2017-03-23: 20 meq via ORAL
  Filled 2017-03-23: qty 1

## 2017-03-23 MED ORDER — DULOXETINE HCL 30 MG PO CPEP
60.0000 mg | ORAL_CAPSULE | Freq: Every day | ORAL | Status: DC
  Administered 2017-03-23: 60 mg via ORAL
  Filled 2017-03-23: qty 2

## 2017-03-23 MED ORDER — TRAZODONE HCL 100 MG PO TABS: 100.0000 mg | ORAL_TABLET | Freq: Every day | ORAL | Status: DC

## 2017-03-23 MED ORDER — LORATADINE 10 MG PO TABS
10.0000 mg | ORAL_TABLET | Freq: Every day | ORAL | Status: DC
  Filled 2017-03-23: qty 1

## 2017-03-23 MED ORDER — MOMETASONE FUROATE 0.1 % EX SOLN: 1.0000 "application " | Freq: Every day | CUTANEOUS | Status: DC

## 2017-03-23 MED ORDER — CALCIPOTRIENE-BETAMETH DIPROP 0.005-0.064 % EX OINT: 1.0000 "application " | TOPICAL_OINTMENT | Freq: Two times a day (BID) | CUTANEOUS | Status: DC

## 2017-03-23 MED ORDER — MONTELUKAST SODIUM 10 MG PO TABS: 10.0000 mg | ORAL_TABLET | Freq: Every day | ORAL | Status: DC

## 2017-03-23 MED ORDER — VARENICLINE TARTRATE 1 MG PO TABS
1.0000 mg | ORAL_TABLET | Freq: Two times a day (BID) | ORAL | Status: DC
  Filled 2017-03-23: qty 1

## 2017-03-23 MED ORDER — AMITRIPTYLINE HCL 10 MG PO TABS: 10.0000 mg | ORAL_TABLET | Freq: Every day | ORAL | Status: DC

## 2017-03-23 MED ORDER — PANTOPRAZOLE SODIUM 40 MG PO TBEC
40.0000 mg | DELAYED_RELEASE_TABLET | Freq: Two times a day (BID) | ORAL | Status: DC
  Administered 2017-03-23: 40 mg via ORAL
  Filled 2017-03-23: qty 1

## 2017-03-23 MED ORDER — TACROLIMUS 0.1 % EX OINT: 1.0000 "application " | TOPICAL_OINTMENT | Freq: Every day | CUTANEOUS | Status: DC

## 2017-03-23 NOTE — Progress Notes (Signed)
Patient ID: Cody Cordova, male   DOB: 06-12-1970, 47 y.o.   MRN: [id number]  Pt was seen and chart reviewed. Pt was discussed with treatment team and is psychiatrically clear for discharge. Pt lives in [place] and may not be able to get a ride back home until later this evening or early tomorrow. Please see TTS counselor's note in chart for information regarding why Pt is in [place]. Pt is most likely drug seeking as PMPAware shows he has an ongoing issue with narcotics and appears to be doctor shopping. Pt is psychiatrically clear.   Laveda AbbeLaurie Britton Ebonie Westerlund, NP-C 03/23/2017      1541

## 2017-03-23 NOTE — BH Assessment (Signed)
[facility]  Per Laveda AbbeLaurie Britton Parks, FNP, this pt does not require psychiatric hospitalization at this time.  [facility] recommendation to contact the customer service phone number found on his health insurance card to find an in network behavioral health provider in his community.  This has been included in pt's discharge instructions.  Pt's nurse, Donnal DebarRandi, has been notified.  Doylene Canninghomas Brigitt Mcclish, MA Triage Specialist [phone]

## 2017-03-23 NOTE — ED Triage Notes (Signed)
Pt is c/o numbness in his left leg and starting in both arms   Pt is c/o neck pain and back pain  Pt states numbness started a week or so ago and has progressively gotten worse  Pt had a MRI on Monday but has not gotten results  Pt lives by himself and states he fall almost every day

## 2017-03-23 NOTE — ED Triage Notes (Signed)
Per EMS: Pt went to the neurologist office. Pt told EMS he cannot talk, but EMS said that stimuli will make him talk. Pt was unable to pay the copay at the neurologist office and then told staff that he was going to kill himself. It was not reported that there was a plan in place for the SI.
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
## 2017-03-23 NOTE — Discharge Instructions (Signed)
For your behavioral health needs, you are advised to contact the customer service phone number on your health insurance card.  They will help you to find a network provider in your community.

## 2017-03-23 NOTE — ED Provider Notes (Signed)
TIME SEEN: 11:48 PM  CHIEF COMPLAINT: Back pain  HPI: Patient is a 47 year old male with history of CAD, CHF, hypertension, insulin-dependent diabetes, COPD, chronic neck and back pain status post 2 previous neck surgeries in 2009 and 2017 who presents emergency department today for his second visit with complaints of neck and back pain.  Patient states that the pain has been exacerbated over the past week and in the neck and lower back.  He has numbness in the right leg.  States this has been going on for 1 week.  Initially denies to me any injury but then states later that he fell this week.  He denies any incontinence.  When asked about urinary retention he states "maybe" he has had these symptoms.  He denies any fevers.  States he was previously in a pain clinic until it closed the summer.  States he recently went back to Dr. Noel Gerold this week when his back started hurting again.  Had an MRI of his back in [place] on the 11th but has not received the results.   ROS: See HPI Constitutional: no fever  Eyes: no drainage  ENT: no runny nose   Cardiovascular:  no chest pain  Resp: no SOB  GI: no vomiting GU: no dysuria Integumentary: no rash  Allergy: no hives  Musculoskeletal: no leg swelling  Neurological: no slurred speech ROS otherwise negative  PAST MEDICAL HISTORY/PAST SURGICAL HISTORY:  Past Medical History:  Diagnosis Date  . Anemia   . Asthma   . CAD (coronary artery disease)   . Cervical myelopathy (HCC)   . CHF (congestive heart failure) (HCC)   . Chronic neck and back pain   . COPD (chronic obstructive pulmonary disease) (HCC)   . Depression   . Diabetes mellitus    insulin dependent  . Difficult intubation    intubated at [facility] 01/03/11  . GERD (gastroesophageal reflux disease)   . Hypertension   . MRSA (methicillin resistant staph aureus) culture positive 10/12   skin lesions  . Obesity   . Pulmonary embolus (HCC)   . Sleep apnea    CPAP at home  . Venous  insufficiency     MEDICATIONS:  Prior to Admission medications   Medication Sig Start Date End Date Taking? Authorizing Provider  amitriptyline (ELAVIL) 10 MG tablet Take 10 mg by mouth at bedtime. 10/16/14   [provider]  betamethasone dipropionate (DIPROLENE) 0.05 % ointment Apply 1 application topically daily. 03/22/17   [provider]  Calcipotriene 0.005 % solution Apply 1 application topically daily. 03/03/17   [provider]  calcipotriene-betamethasone (TACLONEX) ointment Apply 1 application topically 2 (two) times daily.     [provider]  celecoxib (CELEBREX) 200 MG capsule Take 200 mg by mouth 2 (two) times daily.    [provider]  CHANTIX 1 MG tablet Take 1 mg by mouth 2 (two) times daily. 03/06/17   [provider]  clobetasol cream (TEMOVATE) 0.05 % APPLY TO AFFECTED AREA AS DIRECTED 08/05/14   [provider]  COLCRYS 0.6 MG tablet Take 0.6 mg by mouth 2 (two) times daily. 01/29/17   [provider]  diclofenac sodium (VOLTAREN) 1 % GEL Apply 1 application topically. 3 TO 4 TIMES A DAY  AS NEEDED FOR PAIN 03/07/17   [provider]  DULoxetine (CYMBALTA) 30 MG capsule Take 60 mg by mouth daily.     [provider]  DULoxetine (CYMBALTA) 60 MG capsule Take 60 mg by  mouth daily. 01/23/17   [provider]  ENSTILAR 0.005-0.064 % FOAM Apply 1 application topically 2 (two) times daily. 03/16/17   [provider]  fluocinonide (LIDEX) 0.05 % external solution Apply 1 application topically daily. 03/03/17   [provider]  ketoconazole (NIZORAL) 2 % shampoo APPLY TO SCALP ONCE WEEKLY AS NEEDED. 07/08/14   [provider]  levocetirizine (XYZAL) 5 MG tablet Take 5 mg by mouth daily. 03/10/17   [provider]  lidocaine (XYLOCAINE) 5 % ointment  11/13/14   [provider]  LINZESS 145 MCG CAPS capsule Take 145 mcg by mouth daily. 01/23/17   [provider]  metoprolol tartrate (LOPRESSOR) 25 MG tablet Take 1 tablet (25 mg total) by mouth 2 (two) times daily. Patient not taking: Reported on 03/23/2017 04/14/16   Laqueta Linden, MD  mirtazapine (REMERON) 15 MG tablet Take 15 mg by mouth at bedtime. 03/17/17   [provider]  mometasone (ELOCON) 0.1 % lotion Apply 1 application topically daily. 03/05/17   [provider]  montelukast (SINGULAIR) 10 MG tablet Take 1 tablet (10 mg total) by mouth at bedtime. 01/06/11   Jeanella Craze, NP  nitroGLYCERIN (NITROSTAT) 0.4 MG SL tablet Place 1 tablet (0.4 mg total) under the tongue every 5 (five) minutes as needed for chest pain. Patient not taking: Reported on 03/23/2017 04/14/16   Laqueta Linden, MD  pantoprazole (PROTONIX) 40 MG tablet Take 40 mg by mouth 2 (two) times daily.     [provider]  potassium chloride SA (K-DUR,KLOR-CON) 20 MEQ tablet Take 20 mEq by mouth 2 (two) times daily.     [provider]  pregabalin (LYRICA) 100 MG capsule Take 100 mg by mouth 2 (two) times daily.    [provider]  spironolactone (ALDACTONE) 25 MG tablet Take 1 tablet (25 mg total) by mouth daily. Patient not taking: Reported on 03/23/2017 11/18/10 11/17/14  Angiulli, Mcarthur Rossetti, PA-C  tacrolimus (PROTOPIC) 0.1 % ointment Apply 1 application topically daily. 03/02/17   [provider]  traZODone (DESYREL) 100 MG tablet Take 100 mg by mouth at bedtime. 02/09/17   [provider]    ALLERGIES:  Allergies  Allergen Reactions  . Sulfa Antibiotics Shortness Of Breath  . Iohexol Other (See Comments)    Caused hypertension during IVP 1994, USES 13 HR PREP   . Lovenox [Enoxaparin] Other (See Comments)    Dropped platelets down  . Naproxen Swelling    Leg swelling  . Mobic [Meloxicam]   . Ivp Dye [Iodinated Diagnostic Agents] Other (See Comments)  . Tape Rash and Other (See Comments)    Redness     SOCIAL HISTORY:  Social History    Tobacco Use  . Smoking status: Current Some Day Smoker    Types: Cigarettes  . Smokeless tobacco: Never Used  . Tobacco comment: 1 pack every 4-5 days  Substance Use Topics  . Alcohol use: Yes    Comment: occ    FAMILY HISTORY: Family History  Problem Relation Age of Onset  . Heart attack Mother   . Heart disease Mother   . Heart disease Father   . Heart attack Father   . Heart disease Paternal Aunt   . Heart attack Paternal Aunt   . Heart attack Paternal Uncle   . Heart disease Paternal Uncle   . Heart disease Paternal Grandfather     EXAM: BP (!) 159/98 (BP Location: Right Arm)   Pulse  95   Temp 98.6 F (37 C) (Oral)   Resp 18   Ht 5\' 11"  (1.803 m)   Wt 108.9 kg (240 lb)   SpO2 96%   BMI 33.47 kg/m  CONSTITUTIONAL: Alert and oriented and responds appropriately to questions. Well-appearing; well-nourished HEAD: Normocephalic EYES: Conjunctivae clear, pupils appear equal, EOMI ENT: normal nose; moist mucous membranes NECK: Supple, no meningismus, no nuchal rigidity, no LAD  CARD: RRR; S1 and S2 appreciated; no murmurs, no clicks, no rubs, no gallops RESP: Normal chest excursion without splinting or tachypnea; breath sounds clear and equal bilaterally; no wheezes, no rhonchi, no rales, no hypoxia or respiratory distress, speaking full sentences ABD/GI: Normal bowel sounds; non-distended; soft, non-tender, no rebound, no guarding, no peritoneal signs, no hepatosplenomegaly BACK:  The back appears normal and is non-tender to palpation, there is no CVA tenderness EXT: Normal ROM in all joints; non-tender to palpation; no edema; normal capillary refill; no cyanosis, no calf tenderness or swelling    SKIN: Normal color for age and race; warm; lesions consistent with psoriasis noted to his neck with no sign of superimposed infection NEURO: Moves all extremities equally, strength 5/5 in all 4 extremities, does have pain with lifting the right leg, reports tingling in the  right leg but otherwise sensation to light touch intact diffusely, no saddle anesthesia PSYCH: The patient's mood and manner are appropriate. Grooming and personal hygiene are appropriate.  MEDICAL DECISION MAKING: Patient here with complaints of back pain.  This seems to be an acute exacerbation of a chronic problem.  Patient agrees.  No midline spinal tenderness on exam.  Suspect some radiculopathy.  He has no red flag symptoms to suggest cauda equina, cervical myelopathy, spinal stenosis.  Doubt epidural abscess or hematoma, discitis or osteomyelitis.  He is awaiting results from his MRI that was done on the 11th.  He has a Midwife for follow-up.  We will give him a dose of Percocet here and obtain postvoid residual given patient is unsure if he has had any urinary retention.  ED PROGRESS: Patient has 265 mL in his bladder after urination.  He states now that he thinks that he has had urinary retention for the past 1-2 weeks.  States he had similar symptoms with his first neck surgery and it appears that he had cervical myelopathy.  He denies any fever.  He has not a diabetic and no history of HIV.  No history of IV drug abuse.  No epidural injections.  He is afebrile here.  Complains of lumbar pain that has been ongoing for the past week and worsening neck pain for the past 2 weeks.  States that he has been falling for the past 2 months and feels like this is getting worse.  I have witnessed patient ambulating and he seems to have a normal gait.  At this time I feel patient will need to be transferred for emergent MRI of his cervical, thoracic and lumbar spine.  I think this can be done without contrast.  Discussed with Dr. Manus Gunning at [facility] emergency department he agrees to accept patient in transfer.  Patient will be kept n.p.o.  Patient is aware of his MRI showed no emergent abnormality that he will be discharged and will need to follow-up with his PCP and spine surgeon for further pain control  and that he will not be discharged with prescription for narcotics.  I have also called radiology to see if there is any way we can get  the report from MRI done of his lumbar spine on the 11th.  Unfortunately we are not able to see this MRI in our system.   I reviewed all nursing notes, vitals, pertinent previous records, EKGs, lab and urine results, imaging (as available).      Dakai Braithwaite, Layla MawKristen N, DO 03/24/17 [phone]

## 2017-03-23 NOTE — ED Notes (Signed)
Bed: WA27 Expected date:  Expected time:  Means of arrival:  Comments: 

## 2017-03-23 NOTE — ED Notes (Signed)
Bed: [facility] Expected date:  Expected time:  Means of arrival:  Comments: [facility] 

## 2017-03-24 ENCOUNTER — Emergency Department (HOSPITAL_COMMUNITY): Payer: Medicare PPO

## 2017-03-24 LAB — BASIC METABOLIC PANEL
ANION GAP: 10 (ref 5–15)
BUN: 22 mg/dL — ABNORMAL HIGH (ref 6–20)
CALCIUM: 9.2 mg/dL (ref 8.9–10.3)
CO2: 29 mmol/L (ref 22–32)
Chloride: 98 mmol/L — ABNORMAL LOW (ref 101–111)
Creatinine, Ser: 0.88 mg/dL (ref 0.61–1.24)
GFR calc Af Amer: >60 mL/min (ref >60)
GLUCOSE: 131 mg/dL — AB (ref 65–99)
Potassium: 3.6 mmol/L (ref 3.5–5.1)
Sodium: 137 mmol/L (ref 135–145)

## 2017-03-24 LAB — URINALYSIS, ROUTINE W REFLEX MICROSCOPIC
BILIRUBIN URINE: NEGATIVE
GLUCOSE, UA: NEGATIVE mg/dL
Hgb urine dipstick: NEGATIVE
KETONES UR: NEGATIVE mg/dL
LEUKOCYTES UA: NEGATIVE
Nitrite: NEGATIVE
PROTEIN: NEGATIVE mg/dL
Specific Gravity, Urine: 1.015 (ref 1.005–1.030)
pH: 6 (ref 5.0–8.0)

## 2017-03-24 LAB — CBC WITH DIFFERENTIAL/PLATELET
BASOS ABS: 0 10*3/uL (ref 0.0–0.1)
BASOS PCT: 0 %
EOS PCT: 1 %
Eosinophils Absolute: 0.1 10*3/uL (ref 0.0–0.7)
HCT: 44.8 % (ref 39.0–52.0)
Hemoglobin: 14.8 g/dL (ref 13.0–17.0)
Lymphocytes Relative: 31 %
Lymphs Abs: 2.1 10*3/uL (ref 0.7–4.0)
MCH: 28.8 pg (ref 26.0–34.0)
MCHC: 33 g/dL (ref 30.0–36.0)
MCV: 87.3 fL (ref 78.0–100.0)
MONO ABS: 0.3 10*3/uL (ref 0.1–1.0)
Monocytes Relative: 5 %
Neutro Abs: 4.1 10*3/uL (ref 1.7–7.7)
Neutrophils Relative %: 63 %
PLATELETS: 150 10*3/uL (ref 150–400)
RBC: 5.13 MIL/uL (ref 4.22–5.81)
RDW: 14.9 % (ref 11.5–15.5)
WBC: 6.7 10*3/uL (ref 4.0–10.5)

## 2017-03-24 MED ORDER — LORAZEPAM 1 MG PO TABS
1.0000 mg | ORAL_TABLET | Freq: Once | ORAL | Status: AC
  Administered 2017-03-24: 1 mg via ORAL
  Filled 2017-03-24: qty 1

## 2017-03-24 MED ORDER — METHYLPREDNISOLONE 4 MG PO TBPK: ORAL_TABLET | ORAL | 0 refills | Status: AC

## 2017-03-24 MED ORDER — METHOCARBAMOL 500 MG PO TABS: 500.0000 mg | ORAL_TABLET | Freq: Two times a day (BID) | ORAL | 0 refills | Status: AC

## 2017-03-24 MED ORDER — OXYCODONE-ACETAMINOPHEN 5-325 MG PO TABS
2.0000 | ORAL_TABLET | Freq: Once | ORAL | Status: AC
  Administered 2017-03-24: 2 via ORAL
  Filled 2017-03-24: qty 2

## 2017-03-24 NOTE — Discharge Instructions (Signed)
Your MRIs show chronic findings without acute problems. You have some narrowing of your spine in your neck and a bulging disc in your lower back. Followup with Dr. Noel Geroldohen and take the steroids as prescribed. Return to the ED if you develop new or worsening symptoms.

## 2017-03-24 NOTE — ED Notes (Signed)
Pt taken to MRI  

## 2017-03-24 NOTE — ED Notes (Signed)
[facility].  Patient having reportedly urinary retention, urinary incontinence and bowel incontinence.  Patient having right leg numbness.  Patient is able to walk on his feet bilaterally, having equal strength and gait is normal.

## 2017-03-24 NOTE — ED Provider Notes (Signed)
Patient with acute on chronic neck and back pain.  Sent from [facility] for MRI.  Has had some numbness in his right leg and questionable incontinence and urinary retention.  No fever or vomiting.  MRI results obtained.  Negative for acute surgical pathology.  There is stable surgical findings in the cervical spine.  Mild cervical stenosis. Lumbar spine shows areas of bulging disc which could be causing left-sided radiculopathy.  Patient is able to ambulate.  He appears stable to follow-up with his spine surgeon.  He is tolerating p.o.   Glynn Octaveancour, Clista Rainford, MD 03/24/17 [phone]

## 2017-03-24 NOTE — ED Notes (Signed)
Pt remains in MRI 

## 2017-03-24 NOTE — ED Notes (Signed)
Patient ambulatory to the restroom.

## 2017-03-24 NOTE — ED Notes (Signed)
Pt has voided 340 mLs, post void bladder scan revealed 265 mLs, pt reports polyuria

## 2017-03-24 NOTE — ED Notes (Signed)
Pt ambulated down hallway with steady gait. C/o pain in back

## 2017-10-13 ENCOUNTER — Ambulatory Visit: Payer: Medicare PPO | Admitting: Cardiovascular Disease

## 2018-01-30 DIAGNOSIS — G894 Chronic pain syndrome: Secondary | ICD-10-CM | POA: Diagnosis not present

## 2018-01-30 DIAGNOSIS — R6889 Other general symptoms and signs: Secondary | ICD-10-CM | POA: Diagnosis not present

## 2018-01-30 DIAGNOSIS — M47816 Spondylosis without myelopathy or radiculopathy, lumbar region: Secondary | ICD-10-CM | POA: Diagnosis not present

## 2018-01-30 DIAGNOSIS — Z79899 Other long term (current) drug therapy: Secondary | ICD-10-CM | POA: Diagnosis not present

## 2018-01-30 DIAGNOSIS — F1120 Opioid dependence, uncomplicated: Secondary | ICD-10-CM | POA: Diagnosis not present

## 2018-02-15 ENCOUNTER — Encounter: Payer: Self-pay | Admitting: Internal Medicine

## 2018-02-16 DIAGNOSIS — F329 Major depressive disorder, single episode, unspecified: Secondary | ICD-10-CM | POA: Diagnosis not present

## 2018-02-16 DIAGNOSIS — M10172 Lead-induced gout, left ankle and foot: Secondary | ICD-10-CM | POA: Diagnosis not present

## 2018-02-16 DIAGNOSIS — T560X1A Toxic effect of lead and its compounds, accidental (unintentional), initial encounter: Secondary | ICD-10-CM | POA: Diagnosis not present

## 2018-02-16 DIAGNOSIS — I110 Hypertensive heart disease with heart failure: Secondary | ICD-10-CM | POA: Diagnosis not present

## 2018-02-16 DIAGNOSIS — Z79899 Other long term (current) drug therapy: Secondary | ICD-10-CM | POA: Diagnosis not present

## 2018-02-16 DIAGNOSIS — L409 Psoriasis, unspecified: Secondary | ICD-10-CM | POA: Diagnosis not present

## 2018-02-16 DIAGNOSIS — I509 Heart failure, unspecified: Secondary | ICD-10-CM | POA: Diagnosis not present

## 2018-02-16 DIAGNOSIS — M1A1720 Lead-induced chronic gout, left ankle and foot, without tophus (tophi): Secondary | ICD-10-CM | POA: Diagnosis not present

## 2018-02-16 DIAGNOSIS — S99912A Unspecified injury of left ankle, initial encounter: Secondary | ICD-10-CM | POA: Diagnosis not present

## 2018-02-16 DIAGNOSIS — I252 Old myocardial infarction: Secondary | ICD-10-CM | POA: Diagnosis not present

## 2018-02-16 DIAGNOSIS — K219 Gastro-esophageal reflux disease without esophagitis: Secondary | ICD-10-CM | POA: Diagnosis not present

## 2018-02-16 DIAGNOSIS — M25572 Pain in left ankle and joints of left foot: Secondary | ICD-10-CM | POA: Diagnosis not present

## 2018-03-02 DIAGNOSIS — G894 Chronic pain syndrome: Secondary | ICD-10-CM | POA: Diagnosis not present

## 2018-03-02 DIAGNOSIS — M5090 Cervical disc disorder, unspecified, unspecified cervical region: Secondary | ICD-10-CM | POA: Diagnosis not present

## 2018-03-02 DIAGNOSIS — F1120 Opioid dependence, uncomplicated: Secondary | ICD-10-CM | POA: Diagnosis not present

## 2018-03-02 DIAGNOSIS — Z79899 Other long term (current) drug therapy: Secondary | ICD-10-CM | POA: Diagnosis not present

## 2018-03-16 DIAGNOSIS — R6889 Other general symptoms and signs: Secondary | ICD-10-CM | POA: Diagnosis not present

## 2018-03-16 DIAGNOSIS — E876 Hypokalemia: Secondary | ICD-10-CM | POA: Diagnosis not present

## 2018-03-16 DIAGNOSIS — R233 Spontaneous ecchymoses: Secondary | ICD-10-CM | POA: Diagnosis not present

## 2018-03-16 DIAGNOSIS — I10 Essential (primary) hypertension: Secondary | ICD-10-CM | POA: Diagnosis not present

## 2018-03-16 DIAGNOSIS — R7301 Impaired fasting glucose: Secondary | ICD-10-CM | POA: Diagnosis not present

## 2018-03-16 DIAGNOSIS — E782 Mixed hyperlipidemia: Secondary | ICD-10-CM | POA: Diagnosis not present

## 2018-03-16 DIAGNOSIS — G4733 Obstructive sleep apnea (adult) (pediatric): Secondary | ICD-10-CM | POA: Diagnosis not present

## 2018-03-16 DIAGNOSIS — Z0001 Encounter for general adult medical examination with abnormal findings: Secondary | ICD-10-CM | POA: Diagnosis not present

## 2018-03-16 DIAGNOSIS — Z6833 Body mass index (BMI) 33.0-33.9, adult: Secondary | ICD-10-CM | POA: Diagnosis not present

## 2018-03-16 DIAGNOSIS — R300 Dysuria: Secondary | ICD-10-CM | POA: Diagnosis not present

## 2018-03-16 DIAGNOSIS — F331 Major depressive disorder, recurrent, moderate: Secondary | ICD-10-CM | POA: Diagnosis not present

## 2018-03-16 DIAGNOSIS — L03116 Cellulitis of left lower limb: Secondary | ICD-10-CM | POA: Diagnosis not present

## 2018-03-23 DIAGNOSIS — G4733 Obstructive sleep apnea (adult) (pediatric): Secondary | ICD-10-CM | POA: Diagnosis not present

## 2018-03-23 DIAGNOSIS — J449 Chronic obstructive pulmonary disease, unspecified: Secondary | ICD-10-CM | POA: Diagnosis not present

## 2018-03-23 DIAGNOSIS — E139 Other specified diabetes mellitus without complications: Secondary | ICD-10-CM | POA: Diagnosis not present

## 2018-03-23 DIAGNOSIS — M4710 Other spondylosis with myelopathy, site unspecified: Secondary | ICD-10-CM | POA: Diagnosis not present

## 2018-03-26 ENCOUNTER — Ambulatory Visit (INDEPENDENT_AMBULATORY_CARE_PROVIDER_SITE_OTHER): Payer: Medicare HMO | Admitting: Otolaryngology

## 2018-03-30 DIAGNOSIS — G894 Chronic pain syndrome: Secondary | ICD-10-CM | POA: Diagnosis not present

## 2018-03-30 DIAGNOSIS — M5090 Cervical disc disorder, unspecified, unspecified cervical region: Secondary | ICD-10-CM | POA: Diagnosis not present

## 2018-03-30 DIAGNOSIS — R6889 Other general symptoms and signs: Secondary | ICD-10-CM | POA: Diagnosis not present

## 2018-03-30 DIAGNOSIS — F1120 Opioid dependence, uncomplicated: Secondary | ICD-10-CM | POA: Diagnosis not present

## 2018-03-30 DIAGNOSIS — Z79899 Other long term (current) drug therapy: Secondary | ICD-10-CM | POA: Diagnosis not present

## 2018-04-11 DEATH — deceased

## 2018-04-17 ENCOUNTER — Encounter: Payer: Self-pay | Admitting: *Deleted

## 2018-04-25 ENCOUNTER — Ambulatory Visit: Payer: Medicare PPO | Admitting: Gastroenterology

## 2018-06-07 ENCOUNTER — Ambulatory Visit: Payer: Self-pay | Admitting: Gastroenterology

## 2019-08-11 DEATH — deceased
